# Patient Record
Sex: Female | Born: 1985 | Hispanic: No | Marital: Single | Smoking: Never smoker
Health system: Southern US, Community
[De-identification: ages and names within clinical notes are randomized; demographics above are authoritative.]

## PROBLEM LIST (undated history)

## (undated) DIAGNOSIS — U071 COVID-19: Secondary | ICD-10-CM

## (undated) DIAGNOSIS — F419 Anxiety disorder, unspecified: Secondary | ICD-10-CM

## (undated) HISTORY — DX: Anxiety disorder, unspecified: F41.9

## (undated) HISTORY — DX: COVID-19: U07.1

## (undated) HISTORY — PX: TONSILECTOMY/ADENOIDECTOMY WITH MYRINGOTOMY: SHX6125

---
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
## 2010-01-28 DIAGNOSIS — F419 Anxiety disorder, unspecified: Secondary | ICD-10-CM | POA: Insufficient documentation

## 2010-03-26 DIAGNOSIS — F418 Other specified anxiety disorders: Secondary | ICD-10-CM | POA: Insufficient documentation

## 2012-06-21 ENCOUNTER — Ambulatory Visit: Payer: Self-pay | Admitting: Physician Assistant

## 2013-01-16 ENCOUNTER — Emergency Department: Payer: Self-pay | Admitting: Emergency Medicine

## 2018-06-23 DIAGNOSIS — D219 Benign neoplasm of connective and other soft tissue, unspecified: Secondary | ICD-10-CM | POA: Insufficient documentation

## 2019-04-17 DIAGNOSIS — N926 Irregular menstruation, unspecified: Secondary | ICD-10-CM | POA: Insufficient documentation

## 2019-05-06 DIAGNOSIS — J45909 Unspecified asthma, uncomplicated: Secondary | ICD-10-CM

## 2019-05-06 HISTORY — DX: Unspecified asthma, uncomplicated: J45.909

## 2019-07-25 DIAGNOSIS — K219 Gastro-esophageal reflux disease without esophagitis: Secondary | ICD-10-CM | POA: Insufficient documentation

## 2019-07-31 DIAGNOSIS — F410 Panic disorder [episodic paroxysmal anxiety] without agoraphobia: Secondary | ICD-10-CM | POA: Insufficient documentation

## 2019-07-31 DIAGNOSIS — F321 Major depressive disorder, single episode, moderate: Secondary | ICD-10-CM | POA: Insufficient documentation

## 2019-07-31 DIAGNOSIS — F32A Depression, unspecified: Secondary | ICD-10-CM | POA: Insufficient documentation

## 2019-08-13 ENCOUNTER — Ambulatory Visit: Payer: Managed Care, Other (non HMO) | Admitting: Cardiovascular Disease

## 2019-08-13 ENCOUNTER — Other Ambulatory Visit: Payer: Self-pay

## 2019-08-13 ENCOUNTER — Encounter: Payer: Self-pay | Admitting: Cardiovascular Disease

## 2019-08-13 ENCOUNTER — Emergency Department
Admission: EM | Admit: 2019-08-13 | Discharge: 2019-08-13 | Disposition: A | Discharge status: Home / Self Care | Payer: Managed Care, Other (non HMO) | Attending: Emergency Medicine | Admitting: Emergency Medicine

## 2019-08-13 ENCOUNTER — Ambulatory Visit (INDEPENDENT_AMBULATORY_CARE_PROVIDER_SITE_OTHER): Payer: Managed Care, Other (non HMO)

## 2019-08-13 VITALS — BP 132/82 | HR 101 | Ht 68.0 in | Wt 138.2 lb

## 2019-08-13 DIAGNOSIS — I479 Paroxysmal tachycardia, unspecified: Secondary | ICD-10-CM

## 2019-08-13 DIAGNOSIS — R55 Syncope and collapse: Secondary | ICD-10-CM | POA: Insufficient documentation

## 2019-08-13 DIAGNOSIS — F419 Anxiety disorder, unspecified: Secondary | ICD-10-CM

## 2019-08-13 DIAGNOSIS — R202 Paresthesia of skin: Secondary | ICD-10-CM | POA: Insufficient documentation

## 2019-08-13 DIAGNOSIS — J309 Allergic rhinitis, unspecified: Secondary | ICD-10-CM | POA: Insufficient documentation

## 2019-08-13 DIAGNOSIS — G43809 Other migraine, not intractable, without status migrainosus: Secondary | ICD-10-CM | POA: Insufficient documentation

## 2019-08-13 DIAGNOSIS — R42 Dizziness and giddiness: Secondary | ICD-10-CM

## 2019-08-13 DIAGNOSIS — R7303 Prediabetes: Secondary | ICD-10-CM | POA: Insufficient documentation

## 2019-08-13 DIAGNOSIS — J310 Chronic rhinitis: Secondary | ICD-10-CM | POA: Insufficient documentation

## 2019-08-13 MED ORDER — ALPRAZOLAM 0.25 MG PO TABS: 0.2500 mg | ORAL_TABLET | Freq: Three times a day (TID) | ORAL | 0 refills | Status: DC | PRN

## 2019-08-13 NOTE — Patient Instructions (Addendum)
Please stay hydrated  Zio monitor ordered for tachycardia  Medication Instructions:  No changes  If you need a refill on your cardiac medications before your next appointment, please call your pharmacy.    Lab work: TSH at [facility]   If you have labs (blood work) drawn today and your tests are completely normal, you will receive your results only by: Marland Kitchen MyChart Message (if you have MyChart) OR . A paper copy in the mail If you have any lab test that is abnormal or we need to change your treatment, we will call you to review the results.   Testing/Procedures: Your physician has recommended that you wear a Zio monitor. This monitor is a medical device that records the heart's electrical activity. Doctors most often use these monitors to diagnose arrhythmias. Arrhythmias are problems with the speed or rhythm of the heartbeat. The monitor is a small device applied to your chest. You can wear one while you do your normal daily activities. While wearing this monitor if you have any symptoms to push the button and record what you felt. Once you have worn this monitor for the period of time provider prescribed (Usually 14 days), you will return the monitor device in the postage paid box. Once it is returned they will download the data collected and provide [place] with a report which the provider will then review and we will call you with those results. Important tips:  1. Avoid showering during the first 24 hours of wearing the monitor. 2. Avoid excessive sweating to help maximize wear time. 3. Do not submerge the device, no hot tubs, and no swimming pools. 4. Keep any lotions or oils away from the patch. 5. After 24 hours you may shower with the patch on. Take brief showers with your back facing the shower head.  6. Do not remove patch once it has been placed because that will interrupt data and decrease adhesive wear time. 7. Push the button when you have any symptoms and write down what you were  feeling. 8. Once you have completed wearing your monitor, remove and place into box which has postage paid and place in your outgoing mailbox.  9. If for some reason you have misplaced your box then call our office and we can provide another box and/or mail it off for you.        Follow-Up: At [facility], you and your health needs are our priority.  As part of our continuing mission to provide you with exceptional heart care, we have created designated Provider Care Teams.  These Care Teams include your primary Cardiologist (physician) and Advanced Practice Providers (APPs -  Physician Assistants and Nurse Practitioners) who all work together to provide you with the care you need, when you need it.  . You will need a follow up appointment as needed . We will call with the results of the zio monitor  . Providers on your designated Care Team:   . Murray Hodgkins, NP . Christell Faith, PA-C . Marrianne Mood, PA-C  Any Other Special Instructions Will Be Listed Below (If Applicable).  COVID-19 Vaccine Information can be found at: [email or web address] For questions related to vaccine distribution or appointments, please email [email or web address] or call [phone].

## 2019-08-13 NOTE — Discharge Instructions (Signed)
It is important that you keep your scheduled appointment with your psychologist and psychiatrist.   Follow up with your PCP if you have additional concerns prior to your appointment.

## 2019-08-13 NOTE — ED Provider Notes (Signed)
[facility] Emergency Department Provider Note  ____________________________________________  Time seen: Approximately 4:02 PM  I have reviewed the triage vital signs and the nursing notes.   HISTORY  Chief Complaint medication refill   HPI Cynthia Mcintosh is a 34 y.o. female who presents to the emergency department for treatment and evaluation of anxiety.  Patient states that since her endoscopy she has had severe anxiety.  She states that she actually had an episode of chest pain with weakness and shortness of breath that prompted her to go to the emergency department via EMS.  She had a follow-up appointment today with Dr. Rockey Situ and again developed severe anxiety.  She states that she can feel her heart racing.  She denies current chest pain.  She is wearing a Holter monitor for the next 2 weeks.  She was sent to the emergency department from the cardiologist office for further evaluation and treatment of anxiety.  She has an appointment scheduled with a therapist in 2 weeks.  Past Medical History:  Diagnosis Date  . Anxiety     Patient Active Problem List   Diagnosis Date Noted  . Allergic rhinitis 08/13/2019  . Migraine variant 08/13/2019  . Paresthesias 08/13/2019  . Postural dizziness with presyncope 08/13/2019  . Prediabetes 08/13/2019  . 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 Panic attacks 07/31/2019  . Menstrual cycle disorder 04/17/2019  . Situational anxiety 03/26/2010    Past Surgical History:  Procedure Laterality Date  . TONSILECTOMY/ADENOIDECTOMY WITH MYRINGOTOMY      Prior to Admission medications   Medication Sig Start Date End Date Taking? Authorizing Provider  ALPRAZolam (XANAX) 0.25 MG tablet Take 1 tablet (0.25 mg total) by mouth 3 (three) times daily as needed for anxiety. 08/13/19 08/12/20  Haset Oaxaca, Johnette Abraham B, FNP  HYDROXYZINE PAMOATE PO Take by mouth.    [provider]  Multiple Vitamin (MULTI-VITAMIN) tablet Take 1  tablet by mouth daily.    [provider]  traZODone (DESYREL) 50 MG tablet Take by mouth. 07/31/19 10/29/19  [provider]    Allergies Patient has no known allergies.  Family History  Problem Relation Age of Onset  . Hypertension Mother   . Hypercholesterolemia Mother   . Hypertension Father   . Thyroid disease Father   . COPD Maternal Grandmother   . Diabetes Maternal Grandfather   . Hypertension Maternal Grandfather   . Stroke Maternal Grandfather     Social History Social History   Tobacco Use  . Smoking status: Never Smoker  . Smokeless tobacco: Never Used  Substance Use Topics  . Alcohol use: Not Currently  . Drug use: Not Currently    Review of Systems Constitutional: Negative for fever. ENT: Negative for sore throat. Cardiac/Respiratory: Negative for chest pain. Negative for shortness of breath currently. Gastrointestinal: No abdominal pain.  No nausea, no vomiting.  No diarrhea.  Musculoskeletal: Negative for generalized body aches. Skin: Negataive for rash/lesion/wound. Neurological: Negative for headaches, focal weakness or numbness.  ____________________________________________   PHYSICAL EXAM:  VITAL SIGNS: ED Triage Vitals  Enc Vitals Group     BP 08/13/19 1552 121/75     Pulse Rate 08/13/19 1552 86     Resp 08/13/19 1552 18     Temp 08/13/19 1552 98.9 F (37.2 C)     Temp Source 08/13/19 1552 Oral     SpO2 08/13/19 1552 100 %     Weight 08/13/19 1552 136 lb 11 oz (62 kg)     Height  08/13/19 1552 5\' 8"  (1.727 m)     Head Circumference --      Peak Flow --      Pain Score 08/13/19 1549 0     Pain Loc --      Pain Edu? --      Excl. in [place]? --     Constitutional: Alert and oriented. Well appearing and in no acute distress. Eyes: Conjunctivae are normal. PERRL. EOMI. Head: Atraumatic. Nose: No congestion/rhinnorhea. Mouth/Throat: Mucous membranes are moist. Neck: No stridor.  Cardiovascular: Normal rate, regular rhythm.  Good peripheral circulation. Respiratory: Normal respiratory effort. Musculoskeletal: Full ROM throughout.  Neurologic:  Normal speech and language. No gross focal neurologic deficits are appreciated. Speech is normal. No gait instability. Skin:  Skin is warm, dry and intact. No rash noted. Psychiatric: Very anxious. Speaking rapidly with expression.   ____________________________________________   LABS (all labs ordered are listed, but only abnormal results are displayed)  Labs Reviewed - No data to display ____________________________________________  EKG  Not indicated. ____________________________________________  RADIOLOGY  Not indicated. ____________________________________________   PROCEDURES  None ____________________________________________   INITIAL IMPRESSION / ASSESSMENT AND PLAN / ED COURSE     Pertinent labs & imaging results that were available during my care of the patient were reviewed by me and considered in my medical decision making (see chart for details).  Patient was advised to keep her scheduled appointment with psychiatry and psychology.  She was advised to follow-up with her primary care provider for symptoms of concern prior to those scheduled appointments.  She will be given a short supply of Xanax 0.25 mg, but was advised that the emergency department does not refill these medications and that she should take them only for severe panic/anxiety. ____________________________________________   FINAL CLINICAL IMPRESSION(S) / ED DIAGNOSES  Final diagnoses:  Anxiety       Victorino Dike, FNP 08/13/19 1638    Arta Silence, MD 08/13/19 2113

## 2019-08-13 NOTE — Addendum Note (Signed)
Addended by: Valora Corporal on: 08/13/2019 04:09 PM   Modules accepted: Orders

## 2019-08-13 NOTE — Progress Notes (Signed)
Cardiology Office Note  Date:  08/13/2019   ID:  Cynthia Mcintosh, DOB [birth year], MRN [id number]  PCP:  Carloyn Manner, MD   Chief Complaint  Patient presents with  . POTTS Symptoms    New patient self referral, medications verbally reviewed with patient    HPI:  Cynthia Mcintosh is a 34 year old woman with past medical history of Anxiety/depression/panic attacks dating back to 2012 Presents for new patient discussion of her tachycardia  Primary care notes reviewed, detailing long history of anxiety Previously tried Lexapro, BuSpar, Celexa, Xanax, amitriptyline Prior panic attacks, has called EMS for presyncope and tachycardia symptoms Panic attack triggered by new work environment at [employer] of driving also an issue Primary care referred to psychology  Previously seen in the emergency room January 2021 for chest pain/chest tightness Seen in [place] At that time reported fatigue, malaise, nausea, sinus pressure, chest congestion with discomfort, loss of appetite, shortness of breath, abdominal discomfort with cramping, mild diarrhea Work-up in the ER unrevealing for cardiac etiology She was given albuterol inhaler  On further discussion she reports anxiety symptoms have been worse in the past several weeks She is having difficulty sleeping despite taking low-dose trazodone 50 daily and hydroxyzine She has appointment with therapy tomorrow, psychiatry sometime in August She does not feel that psychiatry appointment is sooner now given worsening symptoms In exam room today is shaky, tachycardic On standing up heart rate up to 134 bpm  She does have some lightheadedness and dizziness if she sits up Orthostatics were done, no drop in blood pressure, 409 up to 811 systolic with sitting and standing  Currently staying with family locally, does not feel that she is able to go to work secondary to anxiety symptoms Denies any recent triggers  EKG personally reviewed  by myself on todays visit Shows sinus tachycardia rate 101 bpm no significant ST or T wave changes   PMH:   has a past medical history of Anxiety.  PSH:    Past Surgical History:  Procedure Laterality Date  . TONSILECTOMY/ADENOIDECTOMY WITH MYRINGOTOMY      Current Outpatient Medications  Medication Sig Dispense Refill  . HYDROXYZINE PAMOATE PO Take by mouth.    . Multiple Vitamin (MULTI-VITAMIN) tablet Take 1 tablet by mouth daily.    . traZODone (DESYREL) 50 MG tablet Take by mouth.     No current facility-administered medications for this visit.     Allergies:   Patient has no known allergies.   Social History:  The patient  reports that she has never smoked. She has never used smokeless tobacco. She reports previous alcohol use. She reports previous drug use.   Family History:   family history includes COPD in her maternal grandmother; Diabetes in her maternal grandfather; Hypercholesterolemia in her mother; Hypertension in her father, maternal grandfather, and mother; Stroke in her maternal grandfather; Thyroid disease in her father.    Review of Systems: Review of Systems  Constitutional: Negative.   Respiratory: Negative.   Cardiovascular: Negative.   Gastrointestinal: Negative.   Musculoskeletal: Negative.   Neurological: Negative.   Psychiatric/Behavioral: The patient is nervous/anxious and has insomnia.   All other systems reviewed and are negative.   PHYSICAL EXAM: VS:  BP 132/82 (BP Location: Right Arm, Patient Position: Sitting, Cuff Size: Normal)   Pulse (!) 101   Ht 5\' 8"  (1.727 m)   Wt 138 lb 3.2 oz (62.7 kg)   LMP 08/10/2019 (Approximate)   SpO2 98%   BMI 21.01  kg/m  , BMI Body mass index is 21.01 kg/m. GEN: Well nourished, well developed, in no acute distress HEENT: normal Neck: no JVD, carotid bruits, or masses Cardiac: Tachycardic, regular no murmurs, rubs, or gallops,no edema  Respiratory:  clear to auscultation bilaterally, normal work of  breathing GI: soft, nontender, nondistended, + BS MS: no deformity or atrophy Skin: warm and dry, no rash Neuro:  Strength and sensation are intact Psych: euthymic mood, full affect   Recent Labs: No results found for requested labs within last 8760 hours.    Lipid Panel No results found for: CHOL, HDL, LDLCALC, TRIG    Wt Readings from Last 3 Encounters:  08/13/19 138 lb 3.2 oz (62.7 kg)      ASSESSMENT AND PLAN:  Problem List Items Addressed This Visit    None    Visit Diagnoses    Paroxysmal tachycardia (HCC)    -  Primary   Anxiety       Relevant Medications   traZODone (DESYREL) 50 MG tablet   HYDROXYZINE PAMOATE PO   Dizziness         Anxiety Severe symptoms, acute flare of her chronic issues which seem to date back 9 to 10 years Has appointment with therapy tomorrow, psychiatry sometime in August Symptoms are not well controlled on trazodone low-dose with hydroxyzine Difficulty sleeping, waking up anxious, shaky, tachycardic Unable to work past several weeks secondary to symptoms She has very good insight into her issues, requesting help with her anxiety Family member who presents with her today reports that they take Xanax low-dose every day which rheumatically helps her symptoms Patient has been reluctant to take a prescription that is not hers Reports she has taken Xanax in the past, and I believe this worked for her -Symptoms are so severe she is even willing to commit to a voluntary inpatient stay as she feels miserable On exam today , reports that she feels cold, shaky, very anxious, she is tachycardic.  Classic signs of anxiety exacerbation -We will try to call outpatient psychiatry locally on [facility], they will not offer appointment for today -Additional contacts have been provided -Reports that she may not feel safe with herself, in which case she should seek higher level of care or either acute care or emergency room -In terms of cardiac issues,  likely driven by underlying anxiety issues To confirm we have placed a Zio monitor to evaluate heart rhythm and episodes of tachycardia  Paroxysmal tachycardia As above, ZIO monitor placed Less likely POTS though will look for monitor results Recommend she stay aggressively hydrated, avoid caffeine, avoid being excessively overheated   Total encounter time more than 45 minutes  Greater than 50% was spent in counseling and coordination of care with the patient    Signed, Esmond Plants, M.D., Ph.D. [place], [place]

## 2019-08-13 NOTE — ED Notes (Addendum)
Pt brought over from Cardiologist office with c/o needing anxiety medication. Pt states she was over there and jsut became anxious and felt she was going to have panic attack. Pt states she doesn't need blood drawn or anything .She just needs something for her anxiety and the cardiologist is unable to prescribe anything.  Pt denies any SI or HI. Pt is calm and cooperative.

## 2019-08-14 NOTE — Addendum Note (Signed)
Addended by: Britt Bottom on: 08/14/2019 12:58 PM   Modules accepted: Orders

## 2019-08-14 NOTE — Addendum Note (Signed)
Addended by: Valora Corporal on: 08/14/2019 08:11 AM   Modules accepted: Orders

## 2019-08-31 ENCOUNTER — Emergency Department: Payer: Managed Care, Other (non HMO)

## 2019-08-31 ENCOUNTER — Telehealth: Payer: Self-pay | Admitting: Cardiovascular Disease

## 2019-08-31 ENCOUNTER — Other Ambulatory Visit: Payer: Self-pay

## 2019-08-31 ENCOUNTER — Encounter: Payer: Self-pay | Admitting: Emergency Medicine

## 2019-08-31 DIAGNOSIS — R000 Tachycardia, unspecified: Secondary | ICD-10-CM | POA: Diagnosis not present

## 2019-08-31 DIAGNOSIS — R42 Dizziness and giddiness: Secondary | ICD-10-CM | POA: Diagnosis not present

## 2019-08-31 DIAGNOSIS — R0789 Other chest pain: Secondary | ICD-10-CM | POA: Diagnosis present

## 2019-08-31 DIAGNOSIS — F419 Anxiety disorder, unspecified: Secondary | ICD-10-CM | POA: Diagnosis not present

## 2019-08-31 LAB — BASIC METABOLIC PANEL
Anion gap: 8 (ref 5–15)
BUN: 12 mg/dL (ref 6–20)
CO2: 26 mmol/L (ref 22–32)
Calcium: 9.6 mg/dL (ref 8.9–10.3)
Chloride: 104 mmol/L (ref 98–111)
Creatinine, Ser: 0.73 mg/dL (ref 0.44–1.00)
GFR calc Af Amer: >60 mL/min (ref >60)
GFR calc non Af Amer: >60 mL/min (ref >60)
Glucose, Bld: 96 mg/dL (ref 70–99)
Potassium: 3.8 mmol/L (ref 3.5–5.1)
Sodium: 138 mmol/L (ref 135–145)

## 2019-08-31 LAB — CBC
HCT: 39.2 % (ref 36.0–46.0)
Hemoglobin: 13 g/dL (ref 12.0–15.0)
MCH: 31.3 pg (ref 26.0–34.0)
MCHC: 33.2 g/dL (ref 30.0–36.0)
MCV: 94.5 fL (ref 80.0–100.0)
Platelets: 248 10*3/uL (ref 150–400)
RBC: 4.15 MIL/uL (ref 3.87–5.11)
RDW: 12.5 % (ref 11.5–15.5)
WBC: 10.1 10*3/uL (ref 4.0–10.5)
nRBC: 0 % (ref 0.0–0.2)

## 2019-08-31 LAB — TROPONIN I (HIGH SENSITIVITY)
Troponin I (High Sensitivity): <2 ng/L (ref <18)
Troponin I (High Sensitivity): <2 ng/L (ref <18)

## 2019-08-31 NOTE — Telephone Encounter (Signed)
STAT if patient feels like he/she is going to faint   1) Are you dizzy now? Yes feels off chest pressure on waking tach palpitations sob on exertion   2) Do you feel faint or have you passed out? Yes no syncope   3) Do you have any other symptoms?  Lightheaded dizzy tach palpitations extreme fatigue with any activity for last month no change even after seen in ED for anxiety given xanax   4) Have you checked your HR and BP (record if available)?   No bp cuff at home  HR 100-120 elevated with activity

## 2019-08-31 NOTE — Telephone Encounter (Signed)
  Patient Consent for Virtual Visit         Cynthia Mcintosh has provided verbal consent on 08/31/2019 for a virtual visit (video or telephone).   CONSENT FOR VIRTUAL VISIT FOR:  Cynthia Mcintosh  By participating in this virtual visit I agree to the following:  I hereby voluntarily request, consent and authorize [facility] and its employed or contracted physicians, physician assistants, nurse practitioners or other licensed health care professionals (the Practitioner), to provide me with telemedicine health care services (the "Services") as deemed necessary by the treating Practitioner. I acknowledge and consent to receive the Services by the Practitioner via telemedicine. I understand that the telemedicine visit will involve communicating with the Practitioner through live audiovisual communication technology and the disclosure of certain medical information by electronic transmission. I acknowledge that I have been given the opportunity to request an in-person assessment or other available alternative prior to the telemedicine visit and am voluntarily participating in the telemedicine visit.  I understand that I have the right to withhold or withdraw my consent to the use of telemedicine in the course of my care at any time, without affecting my right to future care or treatment, and that the Practitioner or I may terminate the telemedicine visit at any time. I understand that I have the right to inspect all information obtained and/or recorded in the course of the telemedicine visit and may receive copies of available information for a reasonable fee.  I understand that some of the potential risks of receiving the Services via telemedicine include:  Marland Kitchen Delay or interruption in medical evaluation due to technological equipment failure or disruption; . Information transmitted may not be sufficient (e.g. poor resolution of images) to allow for appropriate medical decision making by the Practitioner; and/or   . In rare instances, security protocols could fail, causing a breach of personal health information.  Furthermore, I acknowledge that it is my responsibility to provide information about my medical history, conditions and care that is complete and accurate to the best of my ability. I acknowledge that Practitioner's advice, recommendations, and/or decision may be based on factors not within their control, such as incomplete or inaccurate data provided by me or distortions of diagnostic images or specimens that may result from electronic transmissions. I understand that the practice of medicine is not an exact science and that Practitioner makes no warranties or guarantees regarding treatment outcomes. I acknowledge that a copy of this consent can be made available to me via my patient portal ([place]), or I can request a printed copy by calling the office of [facility].    I understand that my insurance will be billed for this visit.   I have read or had this consent read to me. . I understand the contents of this consent, which adequately explains the benefits and risks of the Services being provided via telemedicine.  . I have been provided ample opportunity to ask questions regarding this consent and the Services and have had my questions answered to my satisfaction. . I give my informed consent for the services to be provided through the use of telemedicine in my medical care

## 2019-08-31 NOTE — ED Triage Notes (Signed)
Pt presents to ER from home via EMS with complaints of Chest pressure, light headache, dizziness, mbr reports started new medications (beta blocker) pt anxious in triage. Pt talks in complete sentences no respiratory distress noted

## 2019-08-31 NOTE — Telephone Encounter (Signed)
Spoke with patient and she states that she continues to have some problems with elevated heart rate and she feels that it is not regulated. She does recognize that anxiety can be causing some of this but she is still having problems. She does have virtual visit with psychiatry today so reviewed that if they can get her on the right medications for her anxiety it may help with the heart rates. Let her know that I would make provider aware and to see what psychiatry may recommend for her treatment. She verbalized understanding with no further questions at this time.

## 2019-08-31 NOTE — Telephone Encounter (Signed)
We will forward results of the ZIO monitor once they become available

## 2019-08-31 NOTE — ED Notes (Signed)
Lab called for repeat troponin.

## 2019-09-01 ENCOUNTER — Emergency Department
Admission: EM | Admit: 2019-09-01 | Discharge: 2019-09-01 | Disposition: A | Discharge status: Home / Self Care | Payer: Managed Care, Other (non HMO) | Attending: Emergency Medicine | Admitting: Emergency Medicine

## 2019-09-01 DIAGNOSIS — R079 Chest pain, unspecified: Secondary | ICD-10-CM

## 2019-09-01 DIAGNOSIS — F419 Anxiety disorder, unspecified: Secondary | ICD-10-CM

## 2019-09-01 LAB — TSH: TSH: 1.108 u[IU]/mL (ref 0.350–4.500)

## 2019-09-01 LAB — T4, FREE: Free T4: 1.16 ng/dL — ABNORMAL HIGH (ref 0.61–1.12)

## 2019-09-01 MED ORDER — ONDANSETRON 4 MG PO TBDP
4.0000 mg | ORAL_TABLET | Freq: Once | ORAL | Status: AC
  Administered 2019-09-01: 4 mg via ORAL
  Filled 2019-09-01: qty 1

## 2019-09-01 MED ORDER — LORAZEPAM 1 MG PO TABS
1.0000 mg | ORAL_TABLET | Freq: Every day | ORAL | 0 refills | Status: AC | PRN
Start: 2019-09-01 — End: 2019-10-01

## 2019-09-01 MED ORDER — LORAZEPAM 1 MG PO TABS
1.0000 mg | ORAL_TABLET | Freq: Once | ORAL | Status: AC
  Administered 2019-09-01: 1 mg via ORAL
  Filled 2019-09-01: qty 1

## 2019-09-01 NOTE — ED Provider Notes (Signed)
[facility] Emergency Department Provider Note  Time seen: 7:33 AM  I have reviewed the triage vital signs and the nursing notes.   HISTORY  Chief Complaint Chest Pain    HPI Cynthia Mcintosh is a 34 y.o. female with a past medical history of anxiety, presents to the emergency department for intermittent tachycardia and chest pressure.  According to the patient a little over a month ago she developed tachycardia symptoms.  Patient states that started approximately 2 days after an endoscopy.  Since that time she has had intermittent symptoms of significant dizziness, lightheadedness tachycardia and panic.  Patient is diagnosed with anxiety.  Has seen her primary care doctor.  Patient saw a psychiatrist was recently started on trazodone as well as Prozac.  Patient is also followed up with a cardiologist and finished a 7-day Holter monitor which she turned in several days ago but is not yet received the results of that test.  Patient has tried trazodone, is currently on Prozac, has tried propranolol without relief.  Patient appears quite anxious during my evaluation.  Denies drugs or alcohol.  Patient does state a lack of sleep but has only been on the trazodone for approximately 2 weeks, states it does work when she takes it.  Past Medical History:  Diagnosis Date  . Anxiety     Patient Active Problem List   Diagnosis Date Noted  . Allergic rhinitis 08/13/2019  . Migraine variant 08/13/2019  . Paresthesias 08/13/2019  . Postural dizziness with presyncope 08/13/2019  . Prediabetes 08/13/2019  . 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 Panic attacks 07/31/2019  . Menstrual cycle disorder 04/17/2019  . Situational anxiety 03/26/2010    Past Surgical History:  Procedure Laterality Date  . TONSILECTOMY/ADENOIDECTOMY WITH MYRINGOTOMY      Prior to Admission medications   Medication Sig Start Date End Date Taking? Authorizing Provider  ALPRAZolam (XANAX) 0.25 MG  tablet Take 1 tablet (0.25 mg total) by mouth 3 (three) times daily as needed for anxiety. 08/13/19 08/12/20  Triplett, Johnette Abraham B, FNP  HYDROXYZINE PAMOATE PO Take by mouth.    [provider]  LORazepam (ATIVAN) 1 MG tablet Take 1 tablet (1 mg total) by mouth daily as needed for anxiety. 09/01/19 10/01/19  Harvest Dark, MD  Multiple Vitamin (MULTI-VITAMIN) tablet Take 1 tablet by mouth daily.    [provider]  traZODone (DESYREL) 50 MG tablet Take by mouth. 07/31/19 10/29/19  [provider]    No Known Allergies  Family History  Problem Relation Age of Onset  . Hypertension Mother   . Hypercholesterolemia Mother   . Hypertension Father   . Thyroid disease Father   . COPD Maternal Grandmother   . Diabetes Maternal Grandfather   . Hypertension Maternal Grandfather   . Stroke Maternal Grandfather     Social History Social History   Tobacco Use  . Smoking status: Never Smoker  . Smokeless tobacco: Never Used  Substance Use Topics  . Alcohol use: Not Currently  . Drug use: Not Currently    Review of Systems Constitutional: Negative for fever. Cardiovascular: Negative for chest pain. Respiratory: Negative for shortness of breath. Gastrointestinal: Negative for abdominal pain, vomiting Musculoskeletal: Negative for musculoskeletal complaints Neurological: Negative for headache All other ROS negative  ____________________________________________   PHYSICAL EXAM:  VITAL SIGNS: ED Triage Vitals  Enc Vitals Group     BP 08/31/19 1845 117/77     Pulse Rate 08/31/19 1845 76     Resp 08/31/19  1845 18     Temp 08/31/19 1845 98.6 F (37 C)     Temp Source 08/31/19 1845 Oral     SpO2 08/31/19 1845 100 %     Weight 08/31/19 1847 135 lb (61.2 kg)     Height 08/31/19 1847 5\' 8"  (1.727 m)     Head Circumference --      Peak Flow --      Pain Score 08/31/19 1907 0     Pain Loc --      Pain Edu? --      Excl. in [place]? --     Constitutional: Alert and  oriented. Well appearing, but somewhat anxious. Eyes: Normal exam ENT      Head: Normocephalic and atraumatic.       Mouth/Throat: Mucous membranes are moist. Cardiovascular: Normal rate, regular rhythm. No murmur Respiratory: Normal respiratory effort without tachypnea nor retractions. Breath sounds are clear  Gastrointestinal: Soft and nontender. No distention.  Musculoskeletal: Nontender with normal range of motion in all extremities.  Neurologic:  Normal speech and language. No gross focal neurologic deficits  Skin:  Skin is warm, dry and intact.  Psychiatric: Mood and affect are normal.   ____________________________________________    EKG  EKG viewed and interpreted by myself shows a normal sinus rhythm at 74 bpm with a narrow QRS, normal axis, normal intervals, no concerning ST changes.  Normal EKG.  ____________________________________________    RADIOLOGY  Chest x-ray is negative  ____________________________________________   INITIAL IMPRESSION / ASSESSMENT AND PLAN / ED COURSE  Pertinent labs & imaging results that were available during my care of the patient were reviewed by me and considered in my medical decision making (see chart for details).   Patient presents to the emergency department with symptoms of intermittent tachycardia lightheadedness, dizziness, panic.  At this point the patient has seen a PCP, cardiologist and psychiatrist.  Patient has been started on Prozac but this was very recent.  Has been on trazodone for approximately 2 weeks.  Has tried propranolol without relief.  Patient states she has been out of work for nearly 1 month since her symptoms started.  At this point I do believe a trial of benzodiazepines would be warranted.  I spoke to the patient regarding a one-time prescription of Ativan however I had a long discussion with the patient as this is not something we would want her to be using every day as it could be habit forming.  I also  discussed not drinking alcohol or driving while taking the medication.  I also discussed follow-up with her psychiatrist to see if this is a medication that they would like her to be on chronically or at least until the Prozac could take effect.  Remainder the patient's work-up in the emergency department is normal including cardiac enzymes x2, thyroid function EKG and chest x-ray.  Anaija Wissink was evaluated in Emergency Department on 09/01/2019 for the symptoms described in the history of present illness. She was evaluated in the context of the global COVID-19 pandemic, which necessitated consideration that the patient might be at risk for infection with the SARS-CoV-2 virus that causes COVID-19. Institutional protocols and algorithms that pertain to the evaluation of patients at risk for COVID-19 are in a state of rapid change based on information released by regulatory bodies including the CDC and federal and state organizations. These policies and algorithms were followed during the patient's care in the ED.  ____________________________________________   FINAL CLINICAL IMPRESSION(S) /  ED DIAGNOSES  Anxiety Intermittent tachycardia   Harvest Dark, MD 09/01/19 [phone]

## 2019-09-01 NOTE — ED Notes (Signed)
Pt requesting nausea medication. md notified.

## 2019-09-05 ENCOUNTER — Telehealth: Payer: Self-pay | Admitting: Cardiovascular Disease

## 2019-09-05 ENCOUNTER — Encounter: Payer: Self-pay | Admitting: Cardiovascular Disease

## 2019-09-05 ENCOUNTER — Telehealth: Payer: Managed Care, Other (non HMO) | Admitting: Cardiovascular Disease

## 2019-09-05 NOTE — Telephone Encounter (Signed)
Spoke with patient about her appointment today with provider. Advised that since we are still waiting on her results that it may be better for [place] to reschedule or wait for those results to come in first. She inquired about [place] filling out FMLA for her job but again reviewed that with no active cardiac diagnosis we would not be able to do those for her at this time. Encouraged her to have those filled out by her primary care provider, psychiatrist, or her psychologist. Also reviewed that they can fill out forms to be retroactive in order to cover her time missed. Reviewed that once her results come in if there are no active problems showing on the monitor then she may not need follow up appointment. She was agreeable to wait for results and then we can see if repeat appointment is needed. She verbalized understanding of our conversation, agreement with plan, and had no further questions at this time.

## 2019-09-16 DIAGNOSIS — K22710 Barrett's esophagus with low grade dysplasia: Secondary | ICD-10-CM | POA: Insufficient documentation

## 2019-09-17 ENCOUNTER — Ambulatory Visit: Payer: Managed Care, Other (non HMO) | Admitting: Family

## 2019-10-02 ENCOUNTER — Other Ambulatory Visit: Payer: Self-pay

## 2019-10-02 ENCOUNTER — Encounter: Payer: Self-pay | Admitting: *Deleted

## 2019-10-02 ENCOUNTER — Emergency Department
Admission: EM | Admit: 2019-10-02 | Discharge: 2019-10-02 | Disposition: A | Discharge status: Home / Self Care | Payer: Managed Care, Other (non HMO) | Attending: Emergency Medicine | Admitting: Emergency Medicine

## 2019-10-02 ENCOUNTER — Emergency Department: Payer: Managed Care, Other (non HMO)

## 2019-10-02 DIAGNOSIS — R079 Chest pain, unspecified: Secondary | ICD-10-CM | POA: Diagnosis not present

## 2019-10-02 DIAGNOSIS — R519 Headache, unspecified: Secondary | ICD-10-CM | POA: Diagnosis present

## 2019-10-02 DIAGNOSIS — Z5321 Procedure and treatment not carried out due to patient leaving prior to being seen by health care provider: Secondary | ICD-10-CM | POA: Insufficient documentation

## 2019-10-02 DIAGNOSIS — R0602 Shortness of breath: Secondary | ICD-10-CM | POA: Insufficient documentation

## 2019-10-02 LAB — BASIC METABOLIC PANEL
Anion gap: 10 (ref 5–15)
BUN: 10 mg/dL (ref 6–20)
CO2: 23 mmol/L (ref 22–32)
Calcium: 9.7 mg/dL (ref 8.9–10.3)
Chloride: 105 mmol/L (ref 98–111)
Creatinine, Ser: 0.75 mg/dL (ref 0.44–1.00)
GFR calc Af Amer: >60 mL/min (ref >60)
GFR calc non Af Amer: >60 mL/min (ref >60)
Glucose, Bld: 96 mg/dL (ref 70–99)
Potassium: 3.6 mmol/L (ref 3.5–5.1)
Sodium: 138 mmol/L (ref 135–145)

## 2019-10-02 LAB — CBC
HCT: 38.3 % (ref 36.0–46.0)
Hemoglobin: 13 g/dL (ref 12.0–15.0)
MCH: 32.3 pg (ref 26.0–34.0)
MCHC: 33.9 g/dL (ref 30.0–36.0)
MCV: 95.3 fL (ref 80.0–100.0)
Platelets: 221 10*3/uL (ref 150–400)
RBC: 4.02 MIL/uL (ref 3.87–5.11)
RDW: 12.7 % (ref 11.5–15.5)
WBC: 10.4 10*3/uL (ref 4.0–10.5)
nRBC: 0 % (ref 0.0–0.2)

## 2019-10-02 LAB — TROPONIN I (HIGH SENSITIVITY): Troponin I (High Sensitivity): 3 ng/L (ref <18)

## 2019-10-02 NOTE — ED Triage Notes (Addendum)
Pt ambulatory to triage.  Pt reports h/a and intermittent sob with pain in chest.  Hx anxiety.  Pt was seen here 3 weeks ago for similar sx.  Pt alert.  Speech clear.

## 2019-10-02 NOTE — ED Notes (Signed)
Pt refusing cxr.

## 2019-10-11 ENCOUNTER — Telehealth: Payer: Self-pay | Admitting: Cardiovascular Disease

## 2019-10-11 NOTE — Telephone Encounter (Signed)
Received records request, forwarded to Medical records for processing

## 2019-10-29 ENCOUNTER — Other Ambulatory Visit: Payer: Self-pay

## 2019-10-29 ENCOUNTER — Encounter: Payer: Self-pay | Admitting: Certified Nurse Midwife

## 2019-10-29 ENCOUNTER — Ambulatory Visit (INDEPENDENT_AMBULATORY_CARE_PROVIDER_SITE_OTHER): Payer: Managed Care, Other (non HMO) | Admitting: Certified Nurse Midwife

## 2019-10-29 VITALS — BP 121/77 | HR 90 | Wt 137.5 lb

## 2019-10-29 DIAGNOSIS — D251 Intramural leiomyoma of uterus: Secondary | ICD-10-CM | POA: Diagnosis not present

## 2019-10-29 DIAGNOSIS — Z01419 Encounter for gynecological examination (general) (routine) without abnormal findings: Secondary | ICD-10-CM

## 2019-10-29 DIAGNOSIS — Z113 Encounter for screening for infections with a predominantly sexual mode of transmission: Secondary | ICD-10-CM

## 2019-10-29 DIAGNOSIS — N898 Other specified noninflammatory disorders of vagina: Secondary | ICD-10-CM

## 2019-10-29 DIAGNOSIS — Z3009 Encounter for other general counseling and advice on contraception: Secondary | ICD-10-CM

## 2019-10-29 DIAGNOSIS — Z124 Encounter for screening for malignant neoplasm of cervix: Secondary | ICD-10-CM

## 2019-10-29 LAB — POCT URINALYSIS DIPSTICK
Bilirubin, UA: NEGATIVE
Glucose, UA: NEGATIVE
Ketones, UA: NEGATIVE
Leukocytes, UA: NEGATIVE
Nitrite, UA: NEGATIVE
Protein, UA: NEGATIVE
Spec Grav, UA: 1.01 (ref 1.010–1.025)
Urobilinogen, UA: 0.2 E.U./dL
pH, UA: 6 (ref 5.0–8.0)

## 2019-10-29 LAB — POCT URINE PREGNANCY: Preg Test, Ur: NEGATIVE

## 2019-10-29 NOTE — Patient Instructions (Signed)
Preventive Care 21-34 Years Old, Female Preventive care refers to visits with your health care provider and lifestyle choices that can promote health and wellness. This includes:  A yearly physical exam. This may also be called an annual well check.  Regular dental visits and eye exams.  Immunizations.  Screening for certain conditions.  Healthy lifestyle choices, such as eating a healthy diet, getting regular exercise, not using drugs or products that contain nicotine and tobacco, and limiting alcohol use. What can I expect for my preventive care visit? Physical exam Your health care provider will check your:  Height and weight. This may be used to calculate body mass index (BMI), which tells if you are at a healthy weight.  Heart rate and blood pressure.  Skin for abnormal spots. Counseling Your health care provider may ask you questions about your:  Alcohol, tobacco, and drug use.  Emotional well-being.  Home and relationship well-being.  Sexual activity.  Eating habits.  Work and work environment.  Method of birth control.  Menstrual cycle.  Pregnancy history. What immunizations do I need?  Influenza (flu) vaccine  This is recommended every year. Tetanus, diphtheria, and pertussis (Tdap) vaccine  You may need a Td booster every 10 years. Varicella (chickenpox) vaccine  You may need this if you have not been vaccinated. Human papillomavirus (HPV) vaccine  If recommended by your health care provider, you may need three doses over 6 months. Measles, mumps, and rubella (MMR) vaccine  You may need at least one dose of MMR. You may also need a second dose. Meningococcal conjugate (MenACWY) vaccine  One dose is recommended if you are age 19-21 years and a first-year college student living in a residence hall, or if you have one of several medical conditions. You may also need additional booster doses. Pneumococcal conjugate (PCV13) vaccine  You may need  this if you have certain conditions and were not previously vaccinated. Pneumococcal polysaccharide (PPSV23) vaccine  You may need one or two doses if you smoke cigarettes or if you have certain conditions. Hepatitis A vaccine  You may need this if you have certain conditions or if you travel or work in places where you may be exposed to hepatitis A. Hepatitis B vaccine  You may need this if you have certain conditions or if you travel or work in places where you may be exposed to hepatitis B. Haemophilus influenzae type b (Hib) vaccine  You may need this if you have certain conditions. You may receive vaccines as individual doses or as more than one vaccine together in one shot (combination vaccines). Talk with your health care provider about the risks and benefits of combination vaccines. What tests do I need?  Blood tests  Lipid and cholesterol levels. These may be checked every 5 years starting at age 20.  Hepatitis C test.  Hepatitis B test. Screening  Diabetes screening. This is done by checking your blood sugar (glucose) after you have not eaten for a while (fasting).  Sexually transmitted disease (STD) testing.  BRCA-related cancer screening. This may be done if you have a family history of breast, ovarian, tubal, or peritoneal cancers.  Pelvic exam and Pap test. This may be done every 3 years starting at age 21. Starting at age 30, this may be done every 5 years if you have a Pap test in combination with an HPV test. Talk with your health care provider about your test results, treatment options, and if necessary, the need for more tests.   Follow these instructions at home: Eating and drinking   Eat a diet that includes fresh fruits and vegetables, whole grains, lean protein, and low-fat dairy.  Take vitamin and mineral supplements as recommended by your health care provider.  Do not drink alcohol if: ? Your health care provider tells you not to drink. ? You are  pregnant, may be pregnant, or are planning to become pregnant.  If you drink alcohol: ? Limit how much you have to 0-1 drink a day. ? Be aware of how much alcohol is in your drink. In the [place] one drink equals one 12 oz bottle of beer (355 mL), one 5 oz glass of wine (148 mL), or one 1 oz glass of hard liquor (44 mL). Lifestyle  Take daily care of your teeth and gums.  Stay active. Exercise for at least 30 minutes on 5 or more days each week.  Do not use any products that contain nicotine or tobacco, such as cigarettes, e-cigarettes, and chewing tobacco. If you need help quitting, ask your health care provider.  If you are sexually active, practice safe sex. Use a condom or other form of birth control (contraception) in order to prevent pregnancy and STIs (sexually transmitted infections). If you plan to become pregnant, see your health care provider for a preconception visit. What's next?  Visit your health care provider once a year for a well check visit.  Ask your health care provider how often you should have your eyes and teeth checked.  Stay up to date on all vaccines. This information is not intended to replace advice given to you by your health care provider. Make sure you discuss any questions you have with your health care provider. Document Revised: 09/22/2017 Document Reviewed: 09/22/2017 Elsevier Patient Education  2020 Elsevier Inc. Lactic acid; Citric acid; Potassium bitartrate vaginal gel What is this medicine? LACTIC ACID; CITRIC ACID; POTASSIUM BITARTRATE (LAK tuhk AS id; SIH trik AS id; poe TASS ee um bi TAAR treit) is used for birth control when you have sexual intercourse to help prevent pregnancy. This medicine may be used for other purposes; ask your health care provider or pharmacist if you have questions. COMMON BRAND NAME(S): PHEXXI What should I tell my health care provider before I take this medicine? They need to know if you have any of these  conditions:  frequent kidney or urinary tract infections  HIV or AIDS  pelvic or vaginal infection  sexually transmitted disease, like herpes, gonorrhea, or chlamydia  an unusual or allergic reaction to lactic acid, citric acid, potassium bitartrate, other medicines, foods, dyes, or preservatives  pregnant or trying to get pregnant  breast-feeding How should I use this medicine? This medicine is for use in the vagina only. Follow the directions on the prescription label. Wash hands before and after use. To prevent pregnancy, it is very important that this product is used properly. This product must be applied before sexual contact begins. Do not use it in the rectum. Make sure you carefully read and follow the instructions that come with the product. If you have vaginal sex more than 1 time within 1 hour, you must use a new applicator. Use exactly as directed. Talk to your pediatrician regarding the use of this medicine in children. Special care may be needed. Overdosage: If you think you have taken too much of this medicine contact a poison control center or emergency room at once. NOTE: This medicine is only for you. Do not share this medicine   with others. What if I miss a dose? Use before each time you have sexual intercourse as directed on the label. If you miss a dose, you may become pregnant. What may interact with this medicine? Interactions are not expected. This birth control may be used with other medicines used in the vagina to treat infections including miconazole, metronidazole, and tioconazole. If you have questions ask your doctor or pharmacist. Do not use any other vaginal products at the same time without talking to your health care professional. This list may not describe all possible interactions. Give your health care provider a list of all the medicines, herbs, non-prescription drugs, or dietary supplements you use. Also tell them if you smoke, drink alcohol, or use  illegal drugs. Some items may interact with your medicine. What should I watch for while using this medicine? This product does not protect you against HIV infection (AIDS) or other sexually transmitted diseases. This product may be used at any time of your menstrual cycle. This product may be used as soon as your healthcare provider tells you it is safe for you to have sex after childbirth, abortion, or miscarriage. This product may be used with most hormonal birth control methods; a vaginal diaphragm; or latex, polyurethane and polyisoprene condoms. Avoid using this product with contraceptive vaginal rings. What side effects may I notice from receiving this medicine? Side effects that you should report to your doctor or health care professional as soon as possible:  allergic reactions like skin rash, itching or hives, swelling  signs and symptoms of infection like fever; chills; pelvic pain; cloudy urine; pain or trouble passing urine  vaginal discharge, itching or odor  vaginal irritation or burning Side effects that usually do not require medical attention (report these to your doctor or health care professional if they continue or are bothersome):  mild vaginal irritation This list may not describe all possible side effects. Call your doctor for medical advice about side effects. You may report side effects to FDA at 1-800-FDA-1088. Where should I keep my medicine? Keep out of the reach of children. Store at room temperature between 15 and 30 degrees C (59 and 86 degrees F). Keep in the foil pouch until ready for use. Follow the directions on the product label. Throw away any unused medicine after the expiration date. NOTE: This sheet is a summary. It may not cover all possible information. If you have questions about this medicine, talk to your doctor, pharmacist, or health care provider.  2020 Elsevier/Gold Standard (2018-06-22 09:34:36)  

## 2019-10-29 NOTE — Progress Notes (Signed)
ANNUAL PREVENTATIVE CARE GYN  ENCOUNTER NOTE  Subjective:       Cynthia Mcintosh is a 34 y.o. G0P0000 female here for a routine annual gynecologic exam.  Current complaints: 1. Needs Pap ([employer]) 2. Requests STI testing 3. Vaginal odor and irritation, has new partner  Denies difficulty breathing or respiratory distress, chest pain, abdominal pain, excessive vaginal bleeding, dysuria, and leg pain or swelling.    Gynecologic History  Patient's last menstrual period was 10/04/2019 (exact date). Period Cycle (Days): 28 Period Duration (Days): 5 Period Pattern: Regular Menstrual Flow: Light, Moderate Menstrual Control: Tampon, Panty liner Menstrual Control Change Freq (Hours): 3 Dysmenorrhea: (!) Severe Dysmenorrhea Symptoms: Cramping, Headache, Nausea  Contraception: NFP  Last Pap: unsure  Obstetric History  OB History  Gravida Para Term Preterm AB Living  0 0 0 0 0 0  SAB TAB Ectopic Multiple Live Births  0 0 0 0 0    Past Medical History:  Diagnosis Date  . Anxiety     Past Surgical History:  Procedure Laterality Date  . TONSILECTOMY/ADENOIDECTOMY WITH MYRINGOTOMY      Current Outpatient Medications on File Prior to Visit  Medication Sig Dispense Refill  . ELDERBERRY PO Take by mouth.    Marland Kitchen LORazepam (ATIVAN) 1 MG tablet Take by mouth.    . Multiple Vitamin (MULTI-VITAMIN) tablet Take 1 tablet by mouth daily.    . sertraline (ZOLOFT) 25 MG tablet Take by mouth.    Marland Kitchen FLUoxetine (PROZAC) 10 MG capsule Take 10 mg by mouth daily.     No current facility-administered medications on file prior to visit.    No Known Allergies  Social History   Socioeconomic History  . Marital status: Single    Spouse name: Not on file  . Number of children: Not on file  . Years of education: Not on file  . Highest education level: Not on file  Occupational History  . Not on file  Tobacco Use  . Smoking status: Never Smoker  . Smokeless tobacco: Never Used  Vaping  Use  . Vaping Use: Never used  Substance and Sexual Activity  . Alcohol use: Not Currently  . Drug use: Not Currently  . Sexual activity: Yes    Birth control/protection: None  Other Topics Concern  . Not on file  Social History Narrative  . Not on file   Social Determinants of Health   Financial Resource Strain:   . Difficulty of Paying Living Expenses: Not on file  Food Insecurity:   . Worried About Charity fundraiser in the Last Year: Not on file  . Ran Out of Food in the Last Year: Not on file  Transportation Needs:   . Lack of Transportation (Medical): Not on file  . Lack of Transportation (Non-Medical): Not on file  Physical Activity:   . Days of Exercise per Week: Not on file  . Minutes of Exercise per Session: Not on file  Stress:   . Feeling of Stress : Not on file  Social Connections:   . Frequency of Communication with Friends and Family: Not on file  . Frequency of Social Gatherings with Friends and Family: Not on file  . Attends Religious Services: Not on file  . Active Member of Clubs or Organizations: Not on file  . Attends Archivist Meetings: Not on file  . Marital Status: Not on file  Intimate Partner Violence:   . Fear of Current or Ex-Partner: Not on file  .  Emotionally Abused: Not on file  . Physically Abused: Not on file  . Sexually Abused: Not on file    Family History  Problem Relation Age of Onset  . Hypertension Mother   . Hypercholesterolemia Mother   . Hypertension Father   . Thyroid disease Father   . COPD Maternal Grandmother   . Diabetes Maternal Grandfather   . Hypertension Maternal Grandfather   . Stroke Maternal Grandfather     The following portions of the patient's history were reviewed and updated as appropriate: allergies, current medications, past family history, past medical history, past social history, past surgical history and problem list.  Review of Systems  ROS negative except as noted above. Information  obtained from patient.    Objective:   BP 121/77   Pulse 90   Wt 137 lb 8 oz (62.4 kg)   LMP 10/04/2019 (Exact Date)   BMI 20.91 kg/m    CONSTITUTIONAL: Well-developed, well-nourished female in no acute distress.   PSYCHIATRIC: Normal mood and affect. Normal behavior. Normal judgment and thought content.  [place]: Alert and oriented to person, place, and time. Normal muscle tone coordination. No cranial nerve deficit noted.  HENT:  Normocephalic, atraumatic, External right and left ear normal.   EYES: Conjunctivae and EOM are normal. Pupils are equal and round.   NECK: Normal range of motion, supple, no masses.  Normal thyroid.   SKIN: Skin is warm and dry. No rash noted. Not diaphoretic. No erythema. No pallor.  CARDIOVASCULAR: Normal heart rate noted, regular rhythm, no murmur.  RESPIRATORY: Clear to auscultation bilaterally. Effort and breath sounds normal, no problems with respiration noted.  BREASTS: Symmetric in size. No masses, skin changes, nipple drainage, or lymphadenopathy.  ABDOMEN: Soft, normal bowel sounds, no distention noted.  No tenderness, rebound or guarding.   PELVIC:  External Genitalia: Normal  Vagina: Normal  Cervix: Normal, Pap collected  Uterus: Normal  Adnexa: Normal  MUSCULOSKELETAL: Normal range of motion. No tenderness.  No cyanosis, clubbing, or edema.  2+ distal pulses.  LYMPHATIC: No Axillary, Supraclavicular, or Inguinal Adenopathy.  Depression screen PHQ 2/9 11/04/2019  Decreased Interest 1  Down, Depressed, Hopeless 1  PHQ - 2 Score 2  Altered sleeping 2  Tired, decreased energy 2  Change in appetite 2  Feeling bad or failure about yourself  0  Trouble concentrating 1  Moving slowly or fidgety/restless 0  Suicidal thoughts 0  PHQ-9 Score 9  Difficult doing work/chores Extremely dIfficult   GAD 7 : Generalized Anxiety Score 11/04/2019  Nervous, Anxious, on Edge 2  Control/stop worrying 2  Worry too much - different  things 2  Trouble relaxing 2  Restless 1  Easily annoyed or irritable 1  Afraid - awful might happen 2  Total GAD 7 Score 12      Assessment:   Annual gynecologic examination 34 y.o.   Contraception: NFP   Normal BMI   Problem List Items Addressed This Visit    None    Visit Diagnoses    Well woman exam    -  Primary   Relevant Orders   POCT urine pregnancy   POCT urinalysis dipstick   NuSwab Vaginitis Plus (VG+)   Hepatitis C antibody   Hepatitis B Surface AntiGEN   RPR   HIV Antibody (routine testing w rflx)   IGP, Aptima HPV, rfx 16/18,45   Intramural leiomyoma of uterus       Routine screening for STI (sexually transmitted infection)  Relevant Orders   NuSwab Vaginitis Plus (VG+)   Hepatitis C antibody   Hepatitis B Surface AntiGEN   RPR   HIV Antibody (routine testing w rflx)   Encounter for other general counseling and advice on contraception       Relevant Orders   POCT urine pregnancy   Screening for cervical cancer       Relevant Orders   IGP, Aptima HPV, rfx 16/18,45   Vaginal odor       Relevant Orders   POCT urinalysis dipstick      Plan:   Pap: Collected  Labs: See orders  Routine preventative health maintenance measures emphasized: Exercise/Diet/Weight control, Tobacco Warnings, Alcohol/Substance use risks, Stress Management, Peer Pressure Issues and Safe Sex; see AVS  Samples of Phexxi and Boric acid provided  Reviewed red flag symptoms and when to call  Return to Clinic - 1 Year for [employer] or sooner if needed   Dani Gobble, CNM  [facility], [facility] 10/29/19 10:17 AM

## 2019-10-30 LAB — HIV ANTIBODY (ROUTINE TESTING W REFLEX): HIV Screen 4th Generation wRfx: NONREACTIVE

## 2019-10-30 LAB — HEPATITIS B SURFACE ANTIGEN: Hepatitis B Surface Ag: NEGATIVE

## 2019-10-30 LAB — RPR: RPR Ser Ql: NONREACTIVE

## 2019-10-30 LAB — HEPATITIS C ANTIBODY: Hep C Virus Ab: <0.1 s/co ratio (ref 0.0–0.9)

## 2019-11-01 ENCOUNTER — Other Ambulatory Visit: Payer: Self-pay

## 2019-11-01 LAB — NUSWAB VAGINITIS PLUS (VG+)
Atopobium vaginae: HIGH Score — AB
BVAB 2: HIGH Score — AB
Candida albicans, NAA: POSITIVE — AB
Candida glabrata, NAA: NEGATIVE
Chlamydia trachomatis, NAA: NEGATIVE
Neisseria gonorrhoeae, NAA: NEGATIVE
Trich vag by NAA: NEGATIVE

## 2019-11-01 MED ORDER — METRONIDAZOLE 1 % EX GEL: Freq: Every day | CUTANEOUS | 0 refills | Status: DC

## 2019-11-01 MED ORDER — FLUCONAZOLE 150 MG PO TABS: 150.0000 mg | ORAL_TABLET | Freq: Every day | ORAL | 1 refills | Status: DC

## 2019-11-02 ENCOUNTER — Other Ambulatory Visit: Payer: Self-pay

## 2019-11-02 MED ORDER — METRONIDAZOLE 0.75 % VA GEL: 1.0000 | Freq: Every day | VAGINAL | 0 refills | Status: AC

## 2019-11-05 LAB — IGP, APTIMA HPV, RFX 16/18,45: HPV Aptima: NEGATIVE

## 2019-11-06 ENCOUNTER — Telehealth: Payer: Self-pay

## 2019-11-06 NOTE — Telephone Encounter (Signed)
Called pt to confirm she was able to pick up her Metrogel from pharmacy. - No answer

## 2019-11-06 NOTE — Telephone Encounter (Signed)
Pt called back in and send that she missed a call. I saw the note from the nurse and told her that the nurse called and that the prescription is able to pick up. The pt verbally understood .

## 2019-11-23 ENCOUNTER — Ambulatory Visit (INDEPENDENT_AMBULATORY_CARE_PROVIDER_SITE_OTHER): Payer: Managed Care, Other (non HMO)

## 2019-11-23 ENCOUNTER — Other Ambulatory Visit: Payer: Self-pay

## 2019-11-23 ENCOUNTER — Encounter: Payer: Self-pay | Admitting: Certified Nurse Midwife

## 2019-11-23 ENCOUNTER — Ambulatory Visit (INDEPENDENT_AMBULATORY_CARE_PROVIDER_SITE_OTHER): Payer: Managed Care, Other (non HMO) | Admitting: Certified Nurse Midwife

## 2019-11-23 ENCOUNTER — Other Ambulatory Visit: Payer: Self-pay | Admitting: Certified Nurse Midwife

## 2019-11-23 VITALS — BP 115/76 | HR 80 | Ht 68.0 in | Wt 140.2 lb

## 2019-11-23 DIAGNOSIS — D251 Intramural leiomyoma of uterus: Secondary | ICD-10-CM

## 2019-11-23 DIAGNOSIS — F3281 Premenstrual dysphoric disorder: Secondary | ICD-10-CM

## 2019-11-23 DIAGNOSIS — N926 Irregular menstruation, unspecified: Secondary | ICD-10-CM

## 2019-11-23 NOTE — Patient Instructions (Signed)
Elagolix; Estradiol; Norethindrone acetate oral capsules What is this medicine? ELAGOLIX; ESTRADIOL; NORETHINDRONE (el a GOE lix; es tra DYE ole; nor eth IN drone) is used to treat heavy menstrual bleeding due to uterine fibroids. This medicine may be used for other purposes; ask your health care provider or pharmacist if you have questions. COMMON BRAND NAME(S): ORIAHNN What should I tell my health care provider before I take this medicine? They need to know if you have any of these conditions:  blood vessel disease or blood clots  breast, cervical, endometrial, or uterine cancer  cigarette smoker  diabetes  gallbladder disease  heart disease  high blood pressure  high cholesterol  kidney disease  liver disease  lupus  migraine headaches  mental illness  osteoporosis  porphyria  stroke  suicidal thoughts, plans, or attempt; a previous suicide attempt by you or a family member  unexplained vaginal bleeding  an unusual or allergic reaction to elagolix, estrogens, progestins, other medicines, foods, dyes, or preservatives  pregnant or trying to get pregnant  breast-feeding How should I use this medicine? Take this medicine by mouth with a glass of water. You can take it with or without food. Follow the directions on the prescription label. Take this medicine at the same times each day. Do not take your medicine more often than directed. A special MedGuide will be given to you by the pharmacist with each prescription and refill. Be sure to read this information carefully each time. Talk to your pediatrician regarding the use of this medicine in children. This medicine is not approved for use in children. Overdosage: If you think you have taken too much of this medicine contact a poison control center or emergency room at once. NOTE: This medicine is only for you. Do not share this medicine with others. What if I miss a dose? If you miss a dose, take it within 4  hours of the time that it was supposed to be taken and take the next dose at the usual time. If more than 4 hours have passed, skip the missed dose. The next dose should be taken at the usual time. Do not take double or extra doses. What may interact with this medicine? Do not take this medicine with any of the following medications:  aromatase inhibitors like aminoglutethimide, anastrozole, exemestane, letrozole, testolactone  cyclosporine  gemfibrozil This medicine may also interact with the following medications:  benzodiazepines  bosentan  bromocriptine  certain antivirals for HIV or hepatitis  certain medicines for seizures like carbamazepine, phenobarbital, phenytoin  cimetidine  citalopram  cyclosporine  dantrolene  digoxin  grapefruit juice  griseofulvin  hydrocortisone, cortisone, or prednisolone  isoniazid (INH)  medicines for diabetes  methadone  methotrexate  midazolam  mineral oil  omeprazole  other female hormones, like estrogens or progestins and birth control pills, patches, rings, or injections  raloxifene  rifabutin, rifampin, or rifapentine  rosuvastatin  tamoxifen  thyroid hormones  topiramate  tricyclic antidepressants  warfarin This list may not describe all possible interactions. Give your health care provider a list of all the medicines, herbs, non-prescription drugs, or dietary supplements you use. Also tell them if you smoke, drink alcohol, or use illegal drugs. Some items may interact with your medicine. What should I watch for while using this medicine? Visit your doctor or health care professional for regular checks on your progress. You will need a regular breast and pelvic exam while on this medicine. It may take several cycles of use to  see improvement in your condition. You may have a change in bleeding pattern or irregular periods. Many females stop having periods while taking this drug. Smoking increases the  risk of getting a blood clot or having a stroke while you are taking this medicine, especially if you are more than 34 years old. You are strongly advised not to smoke. This medicine can make your body retain fluid, making your fingers, hands, or ankles swell. Your blood pressure can go up. Contact your doctor or health care professional if you feel you are retaining fluid. This medicine may cause weak bones (osteoporosis). Only use this product for the amount of time your health care professional tells you to. The longer you use this product the more likely you will be at risk for weak bones. Ask your health care professional how you can keep strong bones. This medicine does not prevent pregnancy. Women must use effective birth control with this medicine. Use a non-hormonal form of birth control while taking this medicine and for 1 week after stopping it. Talk to your health care professional about how to prevent pregnancy. Do not become pregnant while taking this medicine. Women should inform their doctor if they wish to become pregnant or think they might be pregnant. There is a potential for serious side effects to an unborn child. Talk to your health care professional or pharmacist for more information. If you are going to have elective surgery, you may need to stop taking this medicine beforehand. Consult your health care professional for advice prior to scheduling the surgery. If you wear contact lenses and notice visual changes, or if the lenses begin to feel uncomfortable, consult your eye care specialist. This medicine does not protect you against HIV infection (AIDS) or any other sexually transmitted diseases. What side effects may I notice from receiving this medicine? Side effects that you should report to your doctor or health care professional as soon as possible:  allergic reactions like skin rash, itching or hives, swelling of the face, lips, or tongue  anxious  breast tissue changes  or discharge  breathing problems  depressed mood  high blood pressure  signs and symptoms of a blood clot such as chest pain; shortness of breath; pain, swelling, or warmth in the leg  signs and symptoms of a stroke like changes in vision; confusion; trouble speaking or understanding; severe headaches; sudden numbness or weakness of the face, arm or leg; trouble walking; dizziness; loss of balance or coordination  signs and symptoms of liver injury like dark yellow or brown urine; general ill feeling or flu-like symptoms; light-colored stools; loss of appetite; nausea; right upper belly pain; unusually weak or tired; yellowing of the eyes or skin  suicidal thoughts or other mood changes  swelling of the ankles, feet, hands  unusual vaginal bleeding Side effects that usually do not require medical attention (report these to your doctor or health care professional if they continue or are bothersome):  reduced or absent menstrual periods  hair loss  headache  hot flashes or night sweats  nausea  tiredness This list may not describe all possible side effects. Call your doctor for medical advice about side effects. You may report side effects to FDA at 1-800-FDA-1088. Where should I keep my medicine? Keep out of the reach of children. Store at room temperature between 15 and 30 degrees C (59 and 86 degrees F). Throw away any unused medicine after the expiration date on the label. NOTE: This sheet is a  summary. It may not cover all possible information. If you have questions about this medicine, talk to your doctor, pharmacist, or health care provider.  2020 Elsevier/Gold Standard (2018-07-03 11:26:16)   Ethinyl Estradiol; Norethindrone Acetate; Ferrous fumarate tablets or capsules What is this medicine? ETHINYL ESTRADIOL; NORETHINDRONE ACETATE; FERROUS FUMARATE (ETH in il es tra DYE ole; nor eth IN drone AS e tate; FER [place] FUE ma rate) is an oral contraceptive. The products  combine two types of female hormones, an estrogen and a progestin. They are used to prevent ovulation and pregnancy. Some products are also used to treat acne in females. This medicine may be used for other purposes; ask your health care provider or pharmacist if you have questions. COMMON BRAND NAME(S): Aurovela [address] 1/20, Aurovela Fe, [place] [address] Fe, [place] Fe, Gildess 24 Fe, Gildess Fe 1.5/30, Gildess Fe 1/20, Hailey 24 Fe, Hailey Fe 1.5/30, Junel Fe 1.5/30, Junel Fe 1/20, Junel Fe 24, Larin Fe, Lo Loestrin Fe, Loestrin 24 Fe, Loestrin FE 1.5/30, Loestrin FE 1/20, L[place] 24 Fe, Microgestin 24 Fe, Microgestin Fe 1.5/30, Microgestin Fe 1/20, Tarina 24 Fe, Tarina Fe 1/20, [place], Tilia Fe, Tri-Legest Fe What should I tell my health care provider before I take this medicine? They need to know if you have any of these conditions:  abnormal vaginal bleeding  blood vessel disease  breast, cervical, endometrial, ovarian, liver, or uterine cancer  diabetes  gallbladder disease  heart disease or recent heart attack  high blood pressure  high cholesterol  history of blood clots  kidney disease  liver disease  migraine headaches  smoke tobacco  stroke  systemic lupus erythematosus (SLE)  an unusual or allergic reaction to estrogens, progestins, other medicines, foods, dyes, or preservatives  pregnant or trying to get pregnant  breast-feeding How should I use this medicine? Take this medicine by mouth. To reduce nausea, this medicine may be taken with food. Follow the directions on the prescription label. Take this medicine at the same time each day and in the order directed on the package. Do not take your medicine more often than directed. A patient package insert for the product will be given with each prescription and refill. Read this sheet carefully each time. The sheet may change frequently. Contact your pediatrician regarding the use of this medicine in  children. Special care may be needed. This medicine has been used in female children who have started having menstrual periods. Overdosage: If you think you have taken too much of this medicine contact a poison control center or emergency room at once. NOTE: This medicine is only for you. Do not share this medicine with others. What if I miss a dose? If you miss a dose, refer to the patient information sheet you received with your medicine for direction. If you miss more than one pill, this medicine may not be as effective and you may need to use another form of birth control. What may interact with this medicine? Do not take this medicine with the following medication:  dasabuvir; ombitasvir; paritaprevir; ritonavir  ombitasvir; paritaprevir; ritonavir This medicine may also interact with the following medications:  acetaminophen  antibiotics or medicines for infections, especially rifampin, rifabutin, rifapentine, and griseofulvin, and possibly penicillins or tetracyclines  aprepitant  ascorbic acid (vitamin C)  atorvastatin  barbiturate medicines, such as phenobarbital  bosentan  carbamazepine  caffeine  clofibrate  cyclosporine  dantrolene  doxercalciferol  felbamate  grapefruit juice  hydrocortisone  medicines for anxiety or sleeping  problems, such as diazepam or temazepam  medicines for diabetes, including pioglitazone  mineral oil  modafinil  mycophenolate  nefazodone  oxcarbazepine  phenytoin  prednisolone  ritonavir or other medicines for HIV infection or AIDS  rosuvastatin  selegiline  soy isoflavones supplements  St. John's wort  tamoxifen or raloxifene  theophylline  thyroid hormones  topiramate  warfarin This list may not describe all possible interactions. Give your health care provider a list of all the medicines, herbs, non-prescription drugs, or dietary supplements you use. Also tell them if you smoke, drink alcohol,  or use illegal drugs. Some items may interact with your medicine. What should I watch for while using this medicine? Visit your doctor or health care professional for regular checks on your progress. You will need a regular breast and pelvic exam and Pap smear while on this medicine. Use an additional method of contraception during the first cycle that you take these tablets. If you have any reason to think you are pregnant, stop taking this medicine right away and contact your doctor or health care professional. If you are taking this medicine for hormone related problems, it may take several cycles of use to see improvement in your condition. Smoking increases the risk of getting a blood clot or having a stroke while you are taking birth control pills, especially if you are more than 34 years old. You are strongly advised not to smoke. This medicine can make your body retain fluid, making your fingers, hands, or ankles swell. Your blood pressure can go up. Contact your doctor or health care professional if you feel you are retaining fluid. This medicine can make you more sensitive to the sun. Keep out of the sun. If you cannot avoid being in the sun, wear protective clothing and use sunscreen. Do not use sun lamps or tanning beds/booths. If you wear contact lenses and notice visual changes, or if the lenses begin to feel uncomfortable, consult your eye care specialist. In some women, tenderness, swelling, or minor bleeding of the gums may occur. Notify your dentist if this happens. Brushing and flossing your teeth regularly may help limit this. See your dentist regularly and inform your dentist of the medicines you are taking. If you are going to have elective surgery, you may need to stop taking this medicine before the surgery. Consult your health care professional for advice. This medicine does not protect you against HIV infection (AIDS) or any other sexually transmitted diseases. What side  effects may I notice from receiving this medicine? Side effects that you should report to your doctor or health care professional as soon as possible:  allergic reactions like skin rash, itching or hives, swelling of the face, lips, or tongue  breast tissue changes or discharge  changes in vaginal bleeding during your period or between your periods  changes in vision  chest pain  confusion  coughing up blood  dizziness  feeling faint or lightheaded  headaches or migraines  leg, arm or groin pain  loss of balance or coordination  severe or sudden headaches  stomach pain (severe)  sudden shortness of breath  sudden numbness or weakness of the face, arm or leg  symptoms of vaginal infection like itching, irritation or unusual discharge  tenderness in the upper abdomen  trouble speaking or understanding  vomiting  yellowing of the eyes or skin Side effects that usually do not require medical attention (report to your doctor or health care professional if they continue or are  bothersome):  breakthrough bleeding and spotting that continues beyond the 3 initial cycles of pills  breast tenderness  mood changes, anxiety, depression, frustration, anger, or emotional outbursts  increased sensitivity to sun or ultraviolet light  nausea  skin rash, acne, or brown spots on the skin  weight gain (slight) This list may not describe all possible side effects. Call your doctor for medical advice about side effects. You may report side effects to FDA at 1-800-FDA-1088. Where should I keep my medicine? Keep out of the reach of children. Store at room temperature between 15 and 30 degrees C (59 and 86 degrees F). Throw away any unused medicine after the expiration date. NOTE: This sheet is a summary. It may not cover all possible information. If you have questions about this medicine, talk to your doctor, pharmacist, or health care provider.  2020 Elsevier/Gold Standard  (2015-09-22 08:04:41)   Uterine Fibroids  Uterine fibroids (leiomyomas) are noncancerous (benign) tumors that can develop in the uterus. Fibroids may also develop in the fallopian tubes, cervix, or tissues (ligaments) near the uterus. You may have one or many fibroids. Fibroids vary in size, weight, and where they grow in the uterus. Some can become quite large. Most fibroids do not require medical treatment. What are the causes? The cause of this condition is not known. What increases the risk? You are more likely to develop this condition if you:  Are in your 30s or 40s and have not gone through menopause.  Have a family history of this condition.  Are of African-American descent.  Had your first period at an early age (early menarche).  Have not had any children (nulliparity).  Are overweight or obese. What are the signs or symptoms? Many women do not have any symptoms. Symptoms of this condition may include:  Heavy menstrual bleeding.  Bleeding or spotting between periods.  Pain and pressure in the pelvic area, between the hips.  Bladder problems, such as needing to urinate urgently or more often than usual.  Inability to have children (infertility).  Failure to carry pregnancy to term (miscarriage). How is this diagnosed? This condition may be diagnosed based on:  Your symptoms and medical history.  A physical exam.  A pelvic exam that includes feeling for any tumors.  Imaging tests, such as ultrasound or MRI. How is this treated? Treatment for this condition may include:  Seeing your health care provider for follow-up visits to monitor your fibroids for any changes.  Taking NSAIDs such as ibuprofen, naproxen, or aspirin to reduce pain.  Hormone medicines. These may be taken as a pill, given in an injection, or delivered by a T-shaped device that is inserted into the uterus (intrauterine device, IUD).  Surgery to remove one of the following: ? The  fibroids (myomectomy). Your health care provider may recommend this if fibroids affect your fertility and you want to become pregnant. ? The uterus (hysterectomy). ? Blood supply to the fibroids (uterine artery embolization). Follow these instructions at home:  Take over-the-counter and prescription medicines only as told by your health care provider.  Ask your health care provider if you should take iron pills or eat more iron-rich foods, such as dark green, leafy vegetables. Heavy menstrual bleeding can cause low iron levels.  If directed, apply heat to your back or abdomen to reduce pain. Use the heat source that your health care provider recommends, such as a moist heat pack or a heating pad. ? Place a towel between your skin and  the heat source. ? Leave the heat on for 20-30 minutes. ? Remove the heat if your skin turns bright red. This is especially important if you are unable to feel pain, heat, or cold. You may have a greater risk of getting burned.  Pay close attention to your menstrual cycle. Tell your health care provider about any changes, such as: ? Increased blood flow that requires you to use more pads or tampons than usual. ? A change in the number of days that your period lasts. ? A change in symptoms that are associated with your period, such as back pain or cramps in your abdomen.  Keep all follow-up visits as told by your health care provider. This is important, especially if your fibroids need to be monitored for any changes. Contact a health care provider if you:  Have pelvic pain, back pain, or cramps in your abdomen that do not get better with medicine or heat.  Develop new bleeding between periods.  Have increased bleeding during or between periods.  Feel unusually tired or weak.  Feel light-headed. Get help right away if you:  Faint.  Have pelvic pain that suddenly gets worse.  Have severe vaginal bleeding that soaks a tampon or pad in 30 minutes or  less. Summary  Uterine fibroids are noncancerous (benign) tumors that can develop in the uterus.  The exact cause of this condition is not known.  Most fibroids do not require medical treatment unless they affect your ability to have children (fertility).  Contact a health care provider if you have pelvic pain, back pain, or cramps in your abdomen that do not get better with medicines.  Make sure you know what symptoms should cause you to get help right away. This information is not intended to replace advice given to you by your health care provider. Make sure you discuss any questions you have with your health care provider. Document Revised: 12/24/2016 Document Reviewed: 12/07/2016 Elsevier Patient Education  2020 [employer].

## 2019-11-23 NOTE — Progress Notes (Signed)
Starting in July been noting increase in anxiety and panic attacks. Thinks that it may be hormone related.

## 2019-11-23 NOTE — Progress Notes (Signed)
GYN ENCOUNTER NOTE  Subjective:       Cynthia Mcintosh is a 34 y.o. G0P0000 female here for ultrasound review and plan of care discussion.   Patient reports hormonal changes all month, headaches, and abdominal pain that worsens during menses.   History of oral contraception use for many years; not sure if she wishes to continue due to increased risk with age.   Questions PMDD and natural treatment options.   Denies difficulty breathing or respiratory distress, chest pain, abdominal pain, excessive vaginal bleeding, dysuria, and leg pain or swelling.    Gynecologic History  Patient's last menstrual period was 10/30/2019 (exact date).  Contraception: NFP  Last Pap: 10/2019. Results were: Neg/Neg  Obstetric History  OB History  Gravida Para Term Preterm AB Living  0 0 0 0 0 0  SAB TAB Ectopic Multiple Live Births  0 0 0 0 0    Past Medical History:  Diagnosis Date  . Anxiety     Past Surgical History:  Procedure Laterality Date  . TONSILECTOMY/ADENOIDECTOMY WITH MYRINGOTOMY      Current Outpatient Medications on File Prior to Visit  Medication Sig Dispense Refill  . ELDERBERRY PO Take by mouth.    Marland Kitchen LORazepam (ATIVAN) 1 MG tablet Take by mouth.    . Multiple Vitamin (MULTI-VITAMIN) tablet Take 1 tablet by mouth daily.    . pantoprazole (PROTONIX) 40 MG tablet Take by mouth.    . fluconazole (DIFLUCAN) 150 MG tablet Take 1 tablet (150 mg total) by mouth daily. May take 1 tablet 3 days later if symptoms not improved 1 tablet 1  . metroNIDAZOLE (METROGEL) 1 % gel Apply topically daily. For 5 nights 45 g 0  . ondansetron (ZOFRAN) 4 MG tablet Take 4 mg by mouth every 8 (eight) hours as needed.    . sertraline (ZOLOFT) 25 MG tablet Take by mouth. (Patient not taking: Reported on 11/23/2019)     No current facility-administered medications on file prior to visit.    No Known Allergies  Social History   Socioeconomic History  . Marital status: Single    Spouse name: Not  on file  . Number of children: Not on file  . Years of education: Not on file  . Highest education level: Not on file  Occupational History  . Not on file  Tobacco Use  . Smoking status: Never Smoker  . Smokeless tobacco: Never Used  Vaping Use  . Vaping Use: Never used  Substance and Sexual Activity  . Alcohol use: Not Currently  . Drug use: Not Currently  . Sexual activity: Yes    Birth control/protection: None  Other Topics Concern  . Not on file  Social History Narrative  . Not on file   Social Determinants of Health   Financial Resource Strain:   . Difficulty of Paying Living Expenses: Not on file  Food Insecurity:   . Worried About Charity fundraiser in the Last Year: Not on file  . Ran Out of Food in the Last Year: Not on file  Transportation Needs:   . Lack of Transportation (Medical): Not on file  . Lack of Transportation (Non-Medical): Not on file  Physical Activity:   . Days of Exercise per Week: Not on file  . Minutes of Exercise per Session: Not on file  Stress:   . Feeling of Stress : Not on file  Social Connections:   . Frequency of Communication with Friends and Family: Not on file  .  Frequency of Social Gatherings with Friends and Family: Not on file  . Attends Religious Services: Not on file  . Active Member of Clubs or Organizations: Not on file  . Attends Archivist Meetings: Not on file  . Marital Status: Not on file  Intimate Partner Violence:   . Fear of Current or Ex-Partner: Not on file  . Emotionally Abused: Not on file  . Physically Abused: Not on file  . Sexually Abused: Not on file    Family History  Problem Relation Age of Onset  . Hypertension Mother   . Hypercholesterolemia Mother   . Hypertension Father   . Thyroid disease Father   . COPD Maternal Grandmother   . Diabetes Maternal Grandfather   . Hypertension Maternal Grandfather   . Stroke Maternal Grandfather     The following portions of the patient's history  were reviewed and updated as appropriate: allergies, current medications, past family history, past medical history, past social history, past surgical history and problem list.  Review of Systems  ROS negative except as noted above. Information obtained from patient.   Objective:   BP 115/76   Pulse 80   Ht 5\' 8"  (1.727 m)   Wt 140 lb 3.2 oz (63.6 kg)   LMP 10/30/2019 (Exact Date)   BMI 21.32 kg/m    CONSTITUTIONAL: Well-developed, well-nourished female in no acute distress.   PHYSICAL EXAM: Not indicated.   ULTRASOUND REPORT  Location: [facility]  Date of Service: 11/23/2019   Indications:Pelvic Pain Findings:  The uterus is anteverted and measures 9.5 x 3.8 x 7.1 cm. Echo texture is heterogenous with evidence of focal masses. Within the uterus are multiple suspected fibroids measuring: Fibroid 1:Posterior SS 4.0 x 4.1 x 3.6 cm  The Endometrium measures 7 mm.  Right Ovary measures 2.2 x 1.7 x 1.8 cm. It is normal in appearance. Left Ovary measures 2.9 x 2.3 x 2.5 cm. It is normal in appearance. Survey of the adnexa demonstrates no adnexal masses. There is no free fluid in the cul de sac.  Impression: 1. Fibroid was noted as described above. 2. No free fluid seen or adnexal masses seen at this time.  Recommendations: 1.Clinical correlation with the patient's History and Physical Exam.   Assessment:   1. Intramural leiomyoma of uterus   2. PMDD (premenstrual dysphoric disorder)   Plan:   Discussed symptoms management options including herbal supplements, hormonal contraception and [place] agonist; handouts provided.   Reviewed red flag symptoms and when to call.   Patient wishes to try herbal supplement for three (3) months and then follow up for hormone labs.   Reviewed red flag symptoms and when to call.   RTC x 3 months for follow up or sooner if needed.    Dani Gobble, CNM [facility], [facility]

## 2019-11-26 ENCOUNTER — Encounter: Payer: Self-pay | Admitting: Certified Nurse Midwife

## 2019-11-26 ENCOUNTER — Other Ambulatory Visit: Payer: Self-pay | Admitting: Certified Nurse Midwife

## 2019-11-26 DIAGNOSIS — D252 Subserosal leiomyoma of uterus: Secondary | ICD-10-CM | POA: Insufficient documentation

## 2019-11-26 DIAGNOSIS — D250 Submucous leiomyoma of uterus: Secondary | ICD-10-CM | POA: Insufficient documentation

## 2019-11-26 DIAGNOSIS — D259 Leiomyoma of uterus, unspecified: Secondary | ICD-10-CM | POA: Insufficient documentation

## 2019-11-26 DIAGNOSIS — D251 Intramural leiomyoma of uterus: Secondary | ICD-10-CM

## 2019-11-26 MED ORDER — LO LOESTRIN FE 1 MG-10 MCG / 10 MCG PO TABS
1.0000 | ORAL_TABLET | Freq: Every day | ORAL | 4 refills | Status: DC
Start: 2019-11-26 — End: 2020-08-01

## 2019-11-26 NOTE — Progress Notes (Signed)
Rx Lo loestrin, see orders.    Dani Gobble, CNM [facility], [facility] 11/26/19 5:41 PM

## 2019-11-29 ENCOUNTER — Encounter: Payer: Managed Care, Other (non HMO) | Admitting: Certified Nurse Midwife

## 2019-12-05 ENCOUNTER — Telehealth: Payer: Self-pay

## 2019-12-05 NOTE — Telephone Encounter (Signed)
Patient called in because she was wanting to have her fibroids removed vaginally. Patient would like to know what the next steps would be to have that completed.  Could you please advise?

## 2019-12-06 ENCOUNTER — Telehealth: Payer: Self-pay

## 2019-12-06 NOTE — Telephone Encounter (Signed)
This patient was instructed to be scheduled with an MD for a fibroidal removal, patient is scheduled to see you on the 23rd however was wanting to know if this appointment could be done as a televisit or would she need to come into the office to see you?  Could you please advise?

## 2019-12-06 NOTE — Telephone Encounter (Signed)
If she is planning on moving forward, it would be best to have her visit in person so that I could do her pre-op exam. If she just wants to discuss her options further, then a televisit will suffice.

## 2019-12-06 NOTE — Telephone Encounter (Signed)
Called pt vm full  ?

## 2019-12-06 NOTE — Telephone Encounter (Signed)
Please contact patient. Advise that vaginal removal is not an option for her as her fibroids are not submucosal. Submucosal fibroids can potentially be removed internally. Intramural and subserosal fibroids can only be removed externally, using a hysterscope. Her fibroid is intramural, she may schedule an appointment with Dr. Marcelline Mates to discuss further if desired. Thanks, JML

## 2019-12-07 NOTE — Telephone Encounter (Signed)
Called patient to inform her of what you suggested, was unable to speak directly with patient lvm informing her of what you had suggested, and told patient to call the office back if she had any questions or concerns.

## 2019-12-18 ENCOUNTER — Encounter: Payer: Managed Care, Other (non HMO) | Admitting: Obstetrics and Gynecology

## 2019-12-27 ENCOUNTER — Encounter: Payer: Self-pay | Admitting: Obstetrics and Gynecology

## 2019-12-27 ENCOUNTER — Ambulatory Visit (INDEPENDENT_AMBULATORY_CARE_PROVIDER_SITE_OTHER): Payer: Managed Care, Other (non HMO) | Admitting: Obstetrics and Gynecology

## 2019-12-27 ENCOUNTER — Other Ambulatory Visit: Payer: Self-pay

## 2019-12-27 VITALS — BP 104/65 | HR 123 | Ht 68.0 in | Wt 147.3 lb

## 2019-12-27 DIAGNOSIS — F3281 Premenstrual dysphoric disorder: Secondary | ICD-10-CM

## 2019-12-27 DIAGNOSIS — D252 Subserosal leiomyoma of uterus: Secondary | ICD-10-CM

## 2019-12-27 NOTE — Progress Notes (Signed)
Pt present to discuss fibroid removal. Pt stated that she would like to discuss options for her fibroid issues.

## 2019-12-28 ENCOUNTER — Encounter: Payer: Self-pay | Admitting: Obstetrics and Gynecology

## 2019-12-28 DIAGNOSIS — F3281 Premenstrual dysphoric disorder: Secondary | ICD-10-CM | POA: Insufficient documentation

## 2019-12-28 NOTE — Progress Notes (Signed)
GYNECOLOGY PROGRESS NOTE  Subjective:    Patient ID: Cynthia Mcintosh, female    DOB: Nov 15, 1985, 34 y.o.   MRN: [id number]  HPI  Patient is a 34 y.o. G0P0000 female who presents for for consultation of fibroid removal. She is a patient of Dani Gobble, CNM. She also has a history of PMDD and associated anxiety.  She has a history of debilitating cramps, migraine headaches, back pain, bloating, breast tenderness, frequent urination and pelvic pressure during ovulation as well as right before her cycle starts. Has been modifying diet for several years and has tried herbal supplements which have only provided partial relief.  She was prescribed Zoloft for her PMDD and Ativan for her anxiety which have helped some. She was also prescribed a low-dose birth control pill (Lo-Loestrin) for better management of her hormonal symptoms over 1 month ago, however notes she has not started taking it yet as she desired to review her options again. Has used OCPs in her teen years which she notes did help her periods. She notes she has heard of other options for her fibroids and desires to see if anything else will help.    The following portions of the patient's history were reviewed and updated as appropriate:   She  has a past medical history of Anxiety.   She  has a past surgical history that includes Tonsilectomy/adenoidectomy with myringotomy.   Her family history includes COPD in her maternal grandmother; Diabetes in her maternal grandfather; Hypercholesterolemia in her mother; Hypertension in her father, maternal grandfather, and mother; Stroke in her maternal grandfather; Thyroid disease in her father.   She  reports that she has never smoked. She has never used smokeless tobacco. She reports previous alcohol use. She reports previous drug use.   She has a current medication list which includes the following prescription(s): lorazepam, multi-vitamin, ondansetron, pantoprazole, lo loestrin fe, and  sertraline.   She has No Known Allergies..  Review of Systems Pertinent items noted in HPI and remainder of comprehensive ROS otherwise negative.   Objective:   Blood pressure 104/65, pulse (!) 123, height 5\' 8"  (1.727 m), weight 147 lb 4.8 oz (66.8 kg), last menstrual period 12/20/2019. General appearance: alert and no distress Remainder of exam deferred.    Imaging:  US PELVIS (TRANSABDOMINAL ONLY) Patient Name: Cynthia Mcintosh DOB: Oct 28, 1985 MRN: [id number] ULTRASOUND REPORT  Location: [facility] Date of Service: 11/23/2019   Indications:Pelvic Pain Findings:  The uterus is anteverted and measures 9.5 x 3.8 x 7.1 cm. Echo texture is heterogenous with evidence of focal masses. Within the uterus are multiple suspected fibroids measuring: Fibroid 1:Posterior SS 4.0 x 4.1 x 3.6 cm  The Endometrium measures 7 mm.  Right Ovary measures 2.2 x 1.7 x 1.8 cm. It is normal in appearance. Left Ovary measures 2.9 x 2.3 x 2.5 cm. It is normal in appearance. Survey of the adnexa demonstrates no adnexal masses. There is no free fluid in the cul de sac.  Impression: 1. Fibroid was noted as described above. 2. No free fluid seen or adnexal masses seen at this time.  Recommendations: 1.Clinical correlation with the patient's History and Physical Exam.  Jenine M. Albertine Grates    RDMS  I have reviewed this study and agree with documented findings.   Rubie Maid, MD [facility]    Assessment:   1. Fibroids, subserous   2. PMDD (premenstrual dysphoric disorder)     Plan:   1. Lengthy discussion had with patient  about her fibroids and PMDD symptoms.  With regards to her fibroids, I discussed that due to the location of her fibroid it was the likely cause of her cramping and heavy periods, however it could cause some pressure symptoms more specifically in the rectal area as the fibroid is located posteriorly.  It is possible that the fibroid could push the uterus  forward and could lead to some pressure symptoms on the bladder as well.  Discussed options for removal of the fibroid, advised that on myomectomy, likely would be laparoscopic.  Patient inquires about you UFE or radiofrequency ablation.  Discussed that UFE may or may not give her the best results due to location and current fibroid and was usually used more for issues with bleeding, however she could have a consultation to further discuss either option if she desired.  Patient also inquired into Lupron therapy, discussed that this is typically used prior to planned surgical intervention as when she stopped the medication it is possible for the fibroids to continue to grow, and use is for short-term up to 6 months.  I advised if she was concerned about her menstrual cycle as well as her fibroids she could consider the use of Depo-Provera as it has been known to sometimes reduce the volume of fibroids, however patient notes concerns about weight gain and delay in resumption of her cycles if she were to initiate.  Lastly discussed the usage of other oral GnRH agonist medication such as my [place].  Patient states that she does not desire future childbearing, however is not ready to consider definitive surgical management with hysterectomy.  Patient notes that she well try the birth control and see if it helps, and if it does not she will return for further discussion of management of her fibroid.   2.  PMDD symptoms -Incorporated in the discussion of her fibroids we also discussed how the use of hormones could help with her management of PMDD symptoms.  Discussed how the menstrual cycle occurs with regards to fluctuation of hormones at different points in the cycle.  Advised that hormonal control could be gained with use of the birth control.  She also already currently has Zoloft prescribed for her which she states she is taking.  Her anxiety is currently being treated with Ativan as needed during her cycle, as she  typically has more panic attacks around this time.  Advised to give the birth control at least 3 months, and can reassess symptoms at that time.  Patient can follow-up with either midwife or MD.  A total of 15 minutes were spent face-to-face with the patient during this encounter and over half of that time dealt with counseling and coordination of care.   Rubie Maid, MD [facility]

## 2019-12-28 NOTE — Patient Instructions (Signed)
Uterine Fibroids  Uterine fibroids are lumps of tissue (tumors) in your womb (uterus). They are not cancer (are benign). Most women with this condition do not need treatment. Sometimes fibroids can affect your ability to have children (your fertility). If that happens, you may need surgery to take out the fibroids. Follow these instructions at home:  Take over-the-counter and prescription medicines only as told by your doctor. Your doctor may suggest NSAIDs (such as aspirin or ibuprofen) to help with pain.  Ask your doctor if you should: ? Take iron pills. ? Eat more foods that have iron in them, such as dark green, leafy vegetables.  If directed, apply heat to your back or belly to reduce pain. Use the heat source that your doctor recommends, such as a moist heat pack or a heating pad. ? Put a towel between your skin and the heat source. ? Leave the heat on for 20-30 minutes. ? Remove the heat if your skin turns bright red. This is especially important if you are unable to feel pain, heat, or cold. You may have a greater risk of getting burned.  Pay close attention to your period (menstrual) cycles. Tell your doctor about any changes, such as: ? A heavier blood flow than usual. ? Needing to use more pads or tampons than normal. ? A change in how many days your period lasts. ? A change in symptoms that come with your period, such as cramps or back pain.  Keep all follow-up visits as told by your doctor. This is important. Your doctor may need to watch your fibroids over time for any changes. Contact a doctor if you:  Have pain that does not get better with medicine or heat, such as pain or cramps in: ? Your back. ? The area between your hip bones (pelvic area). ? Your belly.  Have new bleeding between your periods.  Have more bleeding during or between your periods.  Feel very tired or weak.  Feel light-headed. Get help right away if you:  Pass out (faint).  Have pain in the  area between your hip bones that suddenly gets worse.  Have bleeding that soaks a tampon or pad in 30 minutes or less. Summary  Uterine fibroids are lumps of tissue (tumors) in your womb (uterus). They are not cancer.  The only treatment that most women need is taking aspirin or ibuprofen for pain.  Contact a doctor if you have pain or cramps that do not get better with medicine.  Make sure you know what symptoms you should get help for right away. This information is not intended to replace advice given to you by your health care provider. Make sure you discuss any questions you have with your health care provider. Document Revised: 12/24/2016 Document Reviewed: 12/07/2016 Elsevier Patient Education  [place].  Premenstrual Syndrome Premenstrual syndrome (PMS) is a group of physical, emotional, and behavioral symptoms that affect women of childbearing age as part of their menstrual cycle. PMS starts 1-2 weeks before the start of a woman's menstrual period and goes away a few days after menstrual bleeding starts. It often happens in a predictable pattern (recurs). PMS may cause other health conditions to become worse, such as asthma, allergies, and migraines. PMS can range from mild to severe. When it is severe, it is called premenstrual dysphoric disorder (PMDD). PMS may interfere with normal daily activities. What are the causes? The cause of this condition is not known, but it seems to be related  to hormone changes that happen before menstruation. What are the signs or symptoms? Symptoms of this condition often happen every month. They go away completely after your period starts. Physical symptoms of this condition include:  Bloating.  Breast pain.  Headaches.  Extreme fatigue.  Backaches.  Swelling of the hands and feet.  Weight gain.  Hot flashes. Emotional and behavioral symptoms of this condition include:  Mood swings.  Depression.  Angry outbursts.   Irritability.  Anxiety.  Crying spells.  Food cravings or appetite changes.  Changes in sexual desire.  Confusion.  Aggression.  Social withdrawal.  Poor concentration. How is this diagnosed? This condition may be diagnosed based on a history of your symptoms. This condition is generally diagnosed if symptoms of PMS:  Are present in the 5 days before your period starts.  End within 4 days after your period starts.  Happen at least 3 months in a row.  Interfere with some of your normal activities. Other conditions that can cause some of these symptoms must be ruled out before PMS can be diagnosed. These include depression, anxiety, anemia, and thyroid problems. How is this treated? This condition may be treated by:  Maintaining a healthy lifestyle. This includes eating a well-balanced diet and exercising regularly.  Taking medicines. Medicines can help relieve symptoms such as cramps, aches, pains, headaches, and breast tenderness. Depending on the severity of the condition, your health care provider may recommend various over-the-counter pain medicines. Follow these instructions at home: Eating and drinking   Eat a well-balanced diet.  Avoid caffeine and alcohol.  Limit the amount of salt and salty foods you eat. This will help reduce bloating.  Drink enough fluid to keep your urine pale yellow.  Take a multivitamin if told to do so by your health care provider. Lifestyle   Do not use any products that contain nicotine or tobacco, such as cigarettes, e-cigarettes, and chewing tobacco. If you need help quitting, ask your health care provider.  Exercise regularly as suggested by your health care provider.  Get enough sleep. For most adults, this is 7-8 hours of sleep each night.  Practice relaxation techniques such as yoga, tai chi, or meditation.  Find healthy ways to manage stress. General instructions   For 2-3 months, write down your symptoms, their  severity, and how long they last. This will help your health care provider choose the best treatment for you.  Take over-the-counter and prescription medicines only as told by your health care provider.  If you are using birth control pills (oral contraceptives), use them as told by your health care provider. Contact a health care provider if:  Your symptoms get worse.  You develop new symptoms.  You have trouble doing your daily activities. Summary  Premenstrual syndrome (PMS) is a group of physical, emotional, and behavioral symptoms that affect women of childbearing age.  PMS starts 1-2 weeks before the start of a woman's period and goes away a few days after the period starts.  PMS is treated by maintaining a healthy lifestyle and taking medicines to relieve the symptoms. This information is not intended to replace advice given to you by your health care provider. Make sure you discuss any questions you have with your health care provider. Document Revised: 08/24/2017 Document Reviewed: 08/24/2017 Elsevier Patient Education  [place].

## 2020-01-02 ENCOUNTER — Encounter: Payer: Managed Care, Other (non HMO) | Admitting: Obstetrics and Gynecology

## 2020-02-29 ENCOUNTER — Encounter: Payer: Managed Care, Other (non HMO) | Admitting: Certified Nurse Midwife

## 2020-03-06 ENCOUNTER — Encounter: Payer: Self-pay | Admitting: Certified Nurse Midwife

## 2020-03-06 ENCOUNTER — Other Ambulatory Visit: Payer: Self-pay

## 2020-03-06 ENCOUNTER — Ambulatory Visit: Payer: Managed Care, Other (non HMO) | Admitting: Certified Nurse Midwife

## 2020-03-06 VITALS — BP 132/84 | HR 90 | Ht 68.0 in | Wt 151.1 lb

## 2020-03-06 DIAGNOSIS — Z113 Encounter for screening for infections with a predominantly sexual mode of transmission: Secondary | ICD-10-CM

## 2020-03-06 DIAGNOSIS — D252 Subserosal leiomyoma of uterus: Secondary | ICD-10-CM | POA: Diagnosis not present

## 2020-03-06 DIAGNOSIS — F3281 Premenstrual dysphoric disorder: Secondary | ICD-10-CM

## 2020-03-06 DIAGNOSIS — Z3041 Encounter for surveillance of contraceptive pills: Secondary | ICD-10-CM | POA: Diagnosis not present

## 2020-03-06 DIAGNOSIS — N644 Mastodynia: Secondary | ICD-10-CM

## 2020-03-06 NOTE — Progress Notes (Signed)
GYN ENCOUNTER NOTE  Subjective:       Cynthia Mcintosh is a 35 y.o. G0P0000 female is here for follow up visit regarding PMDD and menses management.   Last seen in office by Dr. Marcelline Mates on 12/27/2019; for further details of this visit, please see previous note.   Patient taking Lo Loestrin daily, notes spotting only with mild cramping and breast tenderness (requests exam). In the middle of third pack, wishes to continue.   Request labs to check hormone levels in addition to STI testing.   Denies difficulty breathing or respiratory distress, chest pain, abdominal pain, excessive vaginal bleeding, dysuria, and leg pain or swelling.    Gynecologic History  No LMP recorded (lmp unknown).   Contraception: OCP (estrogen/progesterone), Lo Loestrin  Last Pap: 10/2019. Results were: Neg/Neg  Obstetric History  OB History  Gravida Para Term Preterm AB Living  0 0 0 0 0 0  SAB IAB Ectopic Multiple Live Births  0 0 0 0 0    Past Medical History:  Diagnosis Date  . Anxiety   . COVID     Past Surgical History:  Procedure Laterality Date  . TONSILECTOMY/ADENOIDECTOMY WITH MYRINGOTOMY      Current Outpatient Medications on File Prior to Visit  Medication Sig Dispense Refill  . LORazepam (ATIVAN) 1 MG tablet Take by mouth.    . Multiple Vitamin (MULTI-VITAMIN) tablet Take 1 tablet by mouth daily.    . Norethindrone-Ethinyl Estradiol-Fe Biphas (LO LOESTRIN FE) 1 MG-10 MCG / 10 MCG tablet Take 1 tablet by mouth daily. 84 tablet 4   No current facility-administered medications on file prior to visit.    No Known Allergies  Social History   Socioeconomic History  . Marital status: Single    Spouse name: Not on file  . Number of children: Not on file  . Years of education: Not on file  . Highest education level: Not on file  Occupational History  . Not on file  Tobacco Use  . Smoking status: Never Smoker  . Smokeless tobacco: Never Used  Vaping Use  . Vaping Use: Never used   Substance and Sexual Activity  . Alcohol use: Not Currently  . Drug use: Not Currently  . Sexual activity: Yes    Birth control/protection: Pill  Other Topics Concern  . Not on file  Social History Narrative  . Not on file   Social Determinants of Health   Financial Resource Strain: Not on file  Food Insecurity: Not on file  Transportation Needs: Not on file  Physical Activity: Not on file  Stress: Not on file  Social Connections: Not on file  Intimate Partner Violence: Not on file    Family History  Problem Relation Age of Onset  . Hypertension Mother   . Hypercholesterolemia Mother   . Hypertension Father   . Thyroid disease Father   . COPD Maternal Grandmother   . Diabetes Maternal Grandfather   . Hypertension Maternal Grandfather   . Stroke Maternal Grandfather     The following portions of the patient's history were reviewed and updated as appropriate: allergies, current medications, past family history, past medical history, past social history, past surgical history and problem list.  Review of Systems  ROS negative except as noted above. Information obtained from patient.   Objective:   BP 132/84   Pulse 90   Ht 5\' 8"  (1.727 m)   Wt 151 lb 2 oz (68.5 kg)   LMP  (LMP Unknown)  BMI 22.98 kg/m   CONSTITUTIONAL: Well-developed, well-nourished female in no acute distress.   BREASTS: breasts appear normal, no suspicious masses, no skin or nipple changes or axillary nodes.  MUSCULOSKELETAL: Normal range of motion. No tenderness.  No cyanosis, clubbing, or edema.  Assessment:   1. Fibroids, subserous   2. PMDD (premenstrual dysphoric disorder)  - TSH - FSH/LH - Estradiol - Progesterone - Testosterone, Free, Total, SHBG  3. Routine screening for STI (sexually transmitted infection)  - Chlamydia/Gonococcus/Trichomonas, NAA - RPR - HIV Antibody (routine testing w rflx) - Viral Hepatitis HBV, HCV  4. Surveillance for birth control, oral  contraceptives   5. Breast tenderness  - FSH/LH - Estradiol - Progesterone     Plan:   Labs today, see orders.   Continue Lo Loestrin as prescribed.   Home measures for management of breast tenderness sent via MyChart.   Reviewed red flag symptoms and when to call.   RTC x 8 months for ANNUAL EXAM or sooner if needed.    Dani Gobble, CNM [facility], [facility] 03/06/20 3:10 PM

## 2020-03-06 NOTE — Patient Instructions (Signed)
Premenstrual Syndrome Premenstrual syndrome (PMS) is a group of physical, emotional, and behavioral symptoms that affect women as part of their menstrual cycle. PMS occurs 1-2 weeks before the start of a woman's menstrual period and goes away a few days after menstrual bleeding begins. PMS can range from mild to severe. What are the causes? The exact cause of this condition is not known, but it seems to be related to hormone changes that happen before menstruation. What are the signs or symptoms? Symptoms of this condition often happen every month. They go away after your period starts. Physical symptoms of this condition include:  Bloating.  Breast pain or tenderness.  Headaches.  Extreme fatigue.  Backaches.  Swelling of the hands and feet.  Weight gain.  Hot flashes. Emotional symptoms of this condition include:  Mood swings.  Depression.  Angry or hostile outbursts.  Irritability.  Anxiety.  Crying spells. Behavioral symptoms include:  Food cravings or appetite changes.  Changes in sexual desire.  Confusion.  Social withdrawal.  Poor concentration. How is this diagnosed? This condition may be diagnosed based on a history of your symptoms. This condition is generally diagnosed if symptoms of PMS:  Are present in the 5 days before your period starts.  End within 4 days after your period starts.  Happen at least 3 months in a row.  Interfere with some of your normal activities. Other conditions that can cause some of these symptoms must be ruled out before PMS can be diagnosed. These include depression, anxiety, anemia, and thyroid problems. How is this treated? This condition may be treated by doing the following:  Maintaining a healthy lifestyle. This includes eating a well-balanced diet and exercising regularly.  Taking over-the-counter medicines that can help relieve symptoms, such as cramps, aches, pain, headaches, and breast tenderness. Follow  these instructions at home: Eating and drinking  Eat a well-balanced diet.  Avoid caffeine and alcohol.  Limit the amount of salt and salty foods you eat. This will help reduce bloating.  Drink enough fluid to keep your urine pale yellow.  Take a multivitamin if told to do so by your health care provider.   Lifestyle  Do not use any products that contain nicotine or tobacco. These products include cigarettes, chewing tobacco, and vaping devices, such as e-cigarettes. If you need help quitting, ask your health care provider.  Exercise regularly as suggested by your health care provider.  Get enough sleep. For most adults, this is 7-8 hours of sleep each night.  Practice relaxation techniques, such as yoga, tai chi, or meditation.  Find healthy ways to manage stress.   General instructions  For 2-3 months, write down your symptoms, whether they are mild to severe, and how long they last. This will help your health care provider choose the best treatment for you.  Take over-the-counter and prescription medicines only as told by your health care provider.  If you are using birth control pills (oral contraceptives), use them as told by your health care provider.   Contact a health care provider if:  Your symptoms get worse.  You develop new symptoms.  You have trouble doing your daily activities. Summary  Premenstrual syndrome (PMS) is a group of physical, emotional, and behavioral symptoms that affect women as part of their menstrual cycle.  PMS starts 1-2 weeks before the start of a woman's period and goes away a few days after the period starts.  PMS is treated by maintaining a healthy lifestyle and taking medicines  to relieve the symptoms. This information is not intended to replace advice given to you by your health care provider. Make sure you discuss any questions you have with your health care provider. Document Revised: 08/31/2019 Document Reviewed:  08/31/2019 Elsevier Patient Education  2021 [employer].

## 2020-03-07 LAB — TSH: TSH: 1.87 u[IU]/mL (ref 0.450–4.500)

## 2020-03-07 LAB — VIRAL HEPATITIS HBV, HCV
HCV Ab: 0.1 s/co ratio (ref 0.0–0.9)
Hep B Core Total Ab: NEGATIVE
Hep B Surface Ab, Qual: REACTIVE
Hepatitis B Surface Ag: NEGATIVE

## 2020-03-07 LAB — TESTOSTERONE, FREE, TOTAL, SHBG
Sex Hormone Binding: 220 nmol/L — ABNORMAL HIGH (ref 24.6–122.0)
Testosterone, Free: 2.3 pg/mL (ref 0.0–4.2)
Testosterone: 42 ng/dL (ref 8–60)

## 2020-03-07 LAB — PROGESTERONE: Progesterone: 0.1 ng/mL

## 2020-03-07 LAB — HIV ANTIBODY (ROUTINE TESTING W REFLEX): HIV Screen 4th Generation wRfx: NONREACTIVE

## 2020-03-07 LAB — RPR: RPR Ser Ql: NONREACTIVE

## 2020-03-07 LAB — HCV INTERPRETATION

## 2020-03-07 LAB — FSH/LH
FSH: 4.2 m[IU]/mL
LH: 8.5 m[IU]/mL

## 2020-03-07 LAB — ESTRADIOL: Estradiol: 70.5 pg/mL

## 2020-03-08 LAB — CHLAMYDIA/GONOCOCCUS/TRICHOMONAS, NAA
Chlamydia by NAA: NEGATIVE
Gonococcus by NAA: NEGATIVE
Trich vag by NAA: NEGATIVE

## 2020-03-11 NOTE — Telephone Encounter (Signed)
Please have Mia check if CMP can be added on collected labs. Thanks, JML

## 2020-03-13 LAB — COMPREHENSIVE METABOLIC PANEL
ALT: 13 IU/L (ref 0–32)
AST: 18 IU/L (ref 0–40)
Albumin/Globulin Ratio: 1.3 (ref 1.2–2.2)
Albumin: 4.4 g/dL (ref 3.8–4.8)
Alkaline Phosphatase: 39 IU/L — ABNORMAL LOW (ref 44–121)
BUN/Creatinine Ratio: 10 (ref 9–23)
BUN: 8 mg/dL (ref 6–20)
Bilirubin Total: <0.2 mg/dL (ref 0.0–1.2)
CO2: 18 mmol/L — ABNORMAL LOW (ref 20–29)
Calcium: 9.6 mg/dL (ref 8.7–10.2)
Chloride: 102 mmol/L (ref 96–106)
Creatinine, Ser: 0.81 mg/dL (ref 0.57–1.00)
GFR calc Af Amer: 110 mL/min/{1.73_m2} (ref >59)
GFR calc non Af Amer: 95 mL/min/{1.73_m2} (ref >59)
Globulin, Total: 3.5 g/dL (ref 1.5–4.5)
Glucose: 83 mg/dL (ref 65–99)
Potassium: 4 mmol/L (ref 3.5–5.2)
Sodium: 138 mmol/L (ref 134–144)
Total Protein: 7.9 g/dL (ref 6.0–8.5)

## 2020-03-13 LAB — SPECIMEN STATUS REPORT

## 2020-05-03 ENCOUNTER — Emergency Department: Payer: Managed Care, Other (non HMO)

## 2020-05-03 ENCOUNTER — Emergency Department
Admission: EM | Admit: 2020-05-03 | Discharge: 2020-05-03 | Disposition: A | Discharge status: Home / Self Care | Payer: Managed Care, Other (non HMO) | Attending: Emergency Medicine | Admitting: Emergency Medicine

## 2020-05-03 ENCOUNTER — Other Ambulatory Visit: Payer: Self-pay

## 2020-05-03 DIAGNOSIS — R0602 Shortness of breath: Secondary | ICD-10-CM

## 2020-05-03 DIAGNOSIS — R0789 Other chest pain: Secondary | ICD-10-CM

## 2020-05-03 DIAGNOSIS — Z20822 Contact with and (suspected) exposure to covid-19: Secondary | ICD-10-CM | POA: Diagnosis not present

## 2020-05-03 DIAGNOSIS — Z8616 Personal history of COVID-19: Secondary | ICD-10-CM | POA: Insufficient documentation

## 2020-05-03 DIAGNOSIS — J4521 Mild intermittent asthma with (acute) exacerbation: Secondary | ICD-10-CM | POA: Diagnosis not present

## 2020-05-03 DIAGNOSIS — R000 Tachycardia, unspecified: Secondary | ICD-10-CM | POA: Diagnosis not present

## 2020-05-03 LAB — CBC WITH DIFFERENTIAL/PLATELET
Abs Immature Granulocytes: 0.07 10*3/uL (ref 0.00–0.07)
Basophils Absolute: 0 10*3/uL (ref 0.0–0.1)
Basophils Relative: 0 %
Eosinophils Absolute: 0.1 10*3/uL (ref 0.0–0.5)
Eosinophils Relative: 1 %
HCT: 43.1 % (ref 36.0–46.0)
Hemoglobin: 14.3 g/dL (ref 12.0–15.0)
Immature Granulocytes: 1 %
Lymphocytes Relative: 25 %
Lymphs Abs: 2.3 10*3/uL (ref 0.7–4.0)
MCH: 31.2 pg (ref 26.0–34.0)
MCHC: 33.2 g/dL (ref 30.0–36.0)
MCV: 93.9 fL (ref 80.0–100.0)
Monocytes Absolute: 0.6 10*3/uL (ref 0.1–1.0)
Monocytes Relative: 7 %
Neutro Abs: 6.1 10*3/uL (ref 1.7–7.7)
Neutrophils Relative %: 66 %
Platelets: 283 10*3/uL (ref 150–400)
RBC: 4.59 MIL/uL (ref 3.87–5.11)
RDW: 12.5 % (ref 11.5–15.5)
WBC: 9.1 10*3/uL (ref 4.0–10.5)
nRBC: 0 % (ref 0.0–0.2)

## 2020-05-03 LAB — COMPREHENSIVE METABOLIC PANEL
ALT: 19 U/L (ref 0–44)
AST: 25 U/L (ref 15–41)
Albumin: 4.4 g/dL (ref 3.5–5.0)
Alkaline Phosphatase: 34 U/L — ABNORMAL LOW (ref 38–126)
Anion gap: 9 (ref 5–15)
BUN: 13 mg/dL (ref 6–20)
CO2: 23 mmol/L (ref 22–32)
Calcium: 9.6 mg/dL (ref 8.9–10.3)
Chloride: 106 mmol/L (ref 98–111)
Creatinine, Ser: 0.84 mg/dL (ref 0.44–1.00)
GFR, Estimated: >60 mL/min (ref >60)
Glucose, Bld: 99 mg/dL (ref 70–99)
Potassium: 3.6 mmol/L (ref 3.5–5.1)
Sodium: 138 mmol/L (ref 135–145)
Total Bilirubin: 0.6 mg/dL (ref 0.3–1.2)
Total Protein: 9.2 g/dL — ABNORMAL HIGH (ref 6.5–8.1)

## 2020-05-03 LAB — D-DIMER, QUANTITATIVE: D-Dimer, Quant: 0.49 ug/mL-FEU (ref 0.00–0.50)

## 2020-05-03 LAB — SARS CORONAVIRUS 2 (TAT 6-24 HRS): SARS Coronavirus 2: NEGATIVE

## 2020-05-03 LAB — TROPONIN I (HIGH SENSITIVITY): Troponin I (High Sensitivity): <2 ng/L (ref <18)

## 2020-05-03 MED ORDER — IPRATROPIUM-ALBUTEROL 0.5-2.5 (3) MG/3ML IN SOLN
3.0000 mL | Freq: Once | RESPIRATORY_TRACT | Status: AC
  Administered 2020-05-03: 3 mL via RESPIRATORY_TRACT
  Filled 2020-05-03: qty 3

## 2020-05-03 MED ORDER — ALBUTEROL SULFATE HFA 108 (90 BASE) MCG/ACT IN AERS: 2.0000 | INHALATION_SPRAY | Freq: Four times a day (QID) | RESPIRATORY_TRACT | 2 refills | Status: AC | PRN

## 2020-05-03 MED ORDER — GUAIFENESIN ER 600 MG PO TB12
1200.0000 mg | ORAL_TABLET | Freq: Once | ORAL | Status: AC
  Administered 2020-05-03: 1200 mg via ORAL
  Filled 2020-05-03: qty 2

## 2020-05-03 NOTE — ED Notes (Addendum)
Patient transported to XR. 

## 2020-05-03 NOTE — ED Provider Notes (Signed)
[facility] Emergency Department Provider Note ____________________________________________   Event Date/Time   First MD Initiated Contact with Patient 05/03/20 1143     (approximate)  I have reviewed the triage vital signs and the nursing notes.  HISTORY  Chief Complaint Shortness of Breath and Chest Pain   HPI Cynthia Mcintosh is a 35 y.o. femalewho presents to the ED for evaluation of shortness of breath.  Chart review indicates history of anxiety and subserous uterine fibroids.  Migraines and panic attacks.  She self-reports a history of asthma for which she has albuterol inhaler, which expired last month and she has therefore not used.   Patient presents to the ED for evaluation of 2 days of intermittent chest pressure and shortness of breath in the setting of upper respiratory congestion.  Patient reports that she has been congested for a couple days, and while at work today she developed rapid onset shortness of breath and chest pressure.  She reports her albuterol inhaler was expired and so she did not use it.  Due to the increasing shortness of breath and chest pressure, she called 911 and was transported here.  She was concerned that her symptoms are due to anxiety as she self describes her self as anxious person.  Denies syncopal episodes.  She does report being on OCPs.  Denies recent illnesses, steroids or antibiotics.   Past Medical History:  Diagnosis Date  . Anxiety   . COVID     Patient Active Problem List   Diagnosis Date Noted  . PMDD (premenstrual dysphoric disorder) 12/28/2019  . Fibroids, subserous 11/26/2019  . Allergic rhinitis 08/13/2019  . Migraine variant 08/13/2019  . Paresthesias 08/13/2019  . Postural dizziness with presyncope 08/13/2019  . Prediabetes 08/13/2019  . 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 Panic attacks 07/31/2019  . Menstrual cycle disorder 04/17/2019  . Situational anxiety 03/26/2010    Past  Surgical History:  Procedure Laterality Date  . TONSILECTOMY/ADENOIDECTOMY WITH MYRINGOTOMY      Prior to Admission medications   Medication Sig Start Date End Date Taking? Authorizing Provider  albuterol (VENTOLIN HFA) 108 (90 Base) MCG/ACT inhaler Inhale 2 puffs into the lungs every 6 (six) hours as needed for wheezing or shortness of breath. 05/03/20  Yes Vladimir Crofts, MD  LORazepam (ATIVAN) 1 MG tablet Take by mouth. 10/26/19   [provider]  Multiple Vitamin (MULTI-VITAMIN) tablet Take 1 tablet by mouth daily.    [provider]  Norethindrone-Ethinyl Estradiol-Fe Biphas (LO LOESTRIN FE) 1 MG-10 MCG / 10 MCG tablet Take 1 tablet by mouth daily. 11/26/19   Diona Fanti, CNM    Allergies Patient has no known allergies.  Family History  Problem Relation Age of Onset  . Hypertension Mother   . Hypercholesterolemia Mother   . Hypertension Father   . Thyroid disease Father   . COPD Maternal Grandmother   . Diabetes Maternal Grandfather   . Hypertension Maternal Grandfather   . Stroke Maternal Grandfather     Social History Social History   Tobacco Use  . Smoking status: Never Smoker  . Smokeless tobacco: Never Used  Vaping Use  . Vaping Use: Never used  Substance Use Topics  . Alcohol use: Not Currently  . Drug use: Not Currently    Review of Systems  Constitutional: No fever/chills Eyes: No visual changes. ENT: No sore throat. Cardiovascular: Positive for chest pain. Respiratory: Positive shortness of breath Gastrointestinal: No abdominal pain.  No nausea, no vomiting.  No diarrhea.  No constipation. Genitourinary: Negative for dysuria. Musculoskeletal: Negative for back pain. Skin: Negative for rash. Neurological: Negative for headaches, focal weakness or numbness.  ____________________________________________   PHYSICAL EXAM:  VITAL SIGNS: Vitals:   05/03/20 1315 05/03/20 1343  BP:  134/86  Pulse: 95 94  Resp: 20 16   Temp:    SpO2: 97% 100%    Constitutional: Alert and oriented. Well appearing and in no acute distress.  Speaking in full sentences Eyes: Conjunctivae are normal. PERRL. EOMI. Head: Atraumatic. Nose: Upper respiratory congestion and clear rhinorrhea noted.   Mouth/Throat: Mucous membranes are moist.  Oropharynx non-erythematous. Neck: No stridor. No cervical spine tenderness to palpation. Cardiovascular: Normal rate, regular rhythm. Grossly normal heart sounds.  Good peripheral circulation. Respiratory: Normal respiratory effort.  Diffuse and scattered expiratory wheezes with slight decrease in air movement throughout. Gastrointestinal: Soft , nondistended, nontender to palpation. No CVA tenderness. Musculoskeletal: No lower extremity tenderness nor edema.  No joint effusions. No signs of acute trauma. Neurologic:  Normal speech and language. No gross focal neurologic deficits are appreciated. No gait instability noted. Skin:  Skin is warm, dry and intact. No rash noted. Psychiatric: Mood and affect are normal. Speech and behavior are normal. ____________________________________________   LABS (all labs ordered are listed, but only abnormal results are displayed)  Labs Reviewed  COMPREHENSIVE METABOLIC PANEL - Abnormal; Notable for the following components:      Result Value   Total Protein 9.2 (*)    Alkaline Phosphatase 34 (*)    All other components within normal limits  SARS CORONAVIRUS 2 (TAT 6-24 HRS)  CBC WITH DIFFERENTIAL/PLATELET  D-DIMER, QUANTITATIVE  POC URINE PREG, ED  TROPONIN I (HIGH SENSITIVITY)  TROPONIN I (HIGH SENSITIVITY)   ____________________________________________  12 Lead EKG  Sinus rhythm, Rate of 113 bpm.  Normal axis and intervals.  No evidence of acute ischemia.  Sinus tachycardia. ____________________________________________  RADIOLOGY  ED MD interpretation: 2 view CXR reviewed by me without evidence of acute cardiopulmonary  pathology.  Official radiology report(s): DG Chest 2 View  Result Date: 05/03/2020 CLINICAL DATA:  Shortness of breath and chest pressure. EXAM: CHEST - 2 VIEW COMPARISON:  08/31/2019 FINDINGS: The lungs are clear without focal pneumonia, edema, pneumothorax or pleural effusion. The cardiopericardial silhouette is within normal limits for size. The visualized bony structures of the thorax show no acute abnormality. Telemetry leads overlie the chest. IMPRESSION: No active cardiopulmonary disease. Electronically Signed   By: Misty Stanley M.D.   On: 05/03/2020 12:47    ____________________________________________   PROCEDURES and INTERVENTIONS  Procedure(s) performed (including Critical Care):  .1-3 Lead EKG Interpretation Performed by: Vladimir Crofts, MD Authorized by: Vladimir Crofts, MD     Interpretation: abnormal     ECG rate:  111   ECG rate assessment: tachycardic     Rhythm: sinus tachycardia     Ectopy: none     Conduction: normal      Medications  ipratropium-albuterol (DUONEB) 0.5-2.5 (3) MG/3ML nebulizer solution 3 mL (3 mLs Nebulization Given 05/03/20 1217)  guaiFENesin (MUCINEX) 12 hr tablet 1,200 mg (1,200 mg Oral Given 05/03/20 1216)    ____________________________________________   MDM / ED COURSE   35 year old female with history of mild intermittent asthma presents to the ED with chest pain, shortness of breath and congestion, most consistent with mild asthma exacerbation amenable to outpatient management.  She presents tachycardic but hematocrit stable not hypoxic on room air.  Exam demonstrates mild stigmata of asthma exacerbation  with diffuse expiratory wheezes and slight decrease in air movement throughout.  Congestion noted.  No sore throat and uvula is midline without evidence of PTA.  CXR demonstrates no infiltrates or PTX.  Patient has resolution of symptoms after DuoNeb and Mucinex.  Otherwise benign work-up.  D-dimer is negative and effectively rules out acute  PE.  Patient refuses prednisone and is discharged with a prescription for albuterol to use as needed.  We discussed OTC medications to assist with her symptoms and return precautions for the ED prior to discharge.  Clinical Course as of 05/03/20 1526  Sat May 03, 2020  1327 Reassessed.  Patient with resolution of symptoms.  She is requesting a Covid swab.  She is refusing prednisone prescription for asthma exacerbation.  We discussed albuterol inhaler prescription and we discussed return precautions for the ED.  She asks about antibiotics for possible sinus infection, and I educate her that this is not indicated [DS]    Clinical Course User Index [DS] Vladimir Crofts, MD    ____________________________________________   FINAL CLINICAL IMPRESSION(S) / ED DIAGNOSES  Final diagnoses:  Mild intermittent asthma with exacerbation  Other chest pain  Shortness of breath     ED Discharge Orders         Ordered    albuterol (VENTOLIN HFA) 108 (90 Base) MCG/ACT inhaler  Every 6 hours PRN        05/03/20 1329           Charda Janis   Note:  This document was prepared using Systems analyst and may include unintentional dictation errors.   Vladimir Crofts, MD 05/03/20 [phone]

## 2020-05-03 NOTE — ED Triage Notes (Signed)
Pt via POV from home. Pt c/o chest pressure, nasal congestion and SOB for the past two days. Denies cough and fever. Pt states that she has a hx of asthma but did not take the inhaler that she had because its was expired. Pt is A&Ox4 and NAD.

## 2020-05-03 NOTE — Discharge Instructions (Addendum)
As we discussed, please pick up the albuterol inhaler from the local pharmacy.  Ensure you have something like Mucinex or Sudafed to help clear the congestion in your nose.  Please take Tylenol and ibuprofen/Advil for your pain.  It is safe to take them together, or to alternate them every few hours.  Take up to 1000mg  of Tylenol at a time, up to 4 times per day.  Do not take more than 4000 mg of Tylenol in 24 hours.  For ibuprofen, take 400-600 mg, 4-5 times per day.  If you develop any fevers or worsening symptoms despite these medications, please return to the ED.

## 2020-05-27 ENCOUNTER — Institutional Professional Consult (permissible substitution): Payer: Managed Care, Other (non HMO) | Admitting: Pulmonary Disease

## 2020-06-24 ENCOUNTER — Other Ambulatory Visit: Payer: Self-pay

## 2020-06-24 ENCOUNTER — Encounter: Payer: Self-pay | Admitting: Obstetrics and Gynecology

## 2020-06-24 ENCOUNTER — Ambulatory Visit: Payer: Managed Care, Other (non HMO) | Admitting: Obstetrics and Gynecology

## 2020-06-24 VITALS — BP 114/79 | HR 102 | Ht 67.0 in | Wt 153.7 lb

## 2020-06-24 DIAGNOSIS — Z113 Encounter for screening for infections with a predominantly sexual mode of transmission: Secondary | ICD-10-CM

## 2020-06-24 DIAGNOSIS — Z308 Encounter for other contraceptive management: Secondary | ICD-10-CM

## 2020-06-24 DIAGNOSIS — D252 Subserosal leiomyoma of uterus: Secondary | ICD-10-CM | POA: Diagnosis not present

## 2020-06-24 DIAGNOSIS — F3281 Premenstrual dysphoric disorder: Secondary | ICD-10-CM

## 2020-06-24 DIAGNOSIS — Z3041 Encounter for surveillance of contraceptive pills: Secondary | ICD-10-CM

## 2020-06-24 NOTE — Progress Notes (Signed)
Pt present for birth control consult. Pt stated having spotting and bleeding between cycles. Pt requested DTD screening, labs and pregnancy test to be done today.  UPT-neg.

## 2020-06-24 NOTE — Progress Notes (Signed)
    GYNECOLOGY PROGRESS NOTE  Subjective:    Patient ID: Cynthia Mcintosh, female    DOB: 26-Apr-1985, 35 y.o.   MRN: [id number]  HPI  Patient is a 35 y.o. G0P0000 female with a h/o fibroid uterus and PMDD who presents for complaints of spotting between cycles and abdominal pressure during her cycles. After last visit she was initiated on Lo-Loestrin after trialing several different herbal remedies. She reports that it helped with her migraines, however still noting significant anxiety and more emotional lability the 2 weeks prior to her cycle.  Also is noting spotting between her cycles.   Additionally, patient desires STI screening and a pregnancy test done. Has no major concerns for either of these but just desires to be sure.   The following portions of the patient's history were reviewed and updated as appropriate: allergies, current medications, past family history, past medical history, past social history, past surgical history and problem list.  Review of Systems Pertinent items noted in HPI and remainder of comprehensive ROS otherwise negative.   Objective:   Blood pressure 114/79, pulse (!) 102, height 5\' 7"  (1.702 m), weight 153 lb 11.2 oz (69.7 kg). General appearance: alert and no distress Abdomen: soft, non-tender; bowel sounds normal; no masses,  no organomegaly Pelvic: external genitalia normal, rectovaginal septum normal.  Internal exam not performed. Blind genital swab performed.  Extremities: extremities normal, atraumatic, no cyanosis or edema Neurologic: Grossly normal    Labs:  Results for orders placed or performed in visit on 06/24/20  POCT urine pregnancy  Result Value Ref Range   Preg Test, Ur Negative Negative     Assessment:   1. Encounter for other contraceptive management   2. PMDD (premenstrual dysphoric disorder)   3. Screen for STD (sexually transmitted disease)   4. Fibroids, subserous   5. Surveillance for birth control, oral contraceptives       Plan:    1. Reviewed other management options for symptoms again (had previously discussed via Mychart). Could opt for higher dosing combined OCP or, trial of progesterone OCP to manage cycles.  Patient will try progesterone OCP. Given sample of Slynd. To complete current pack of Lo-Loestrin and begin Slynd after next cycle.  2. PMDD, currently not well managed with current OCP. Previous discussion had via Mychart about options of use of the progesterone-only dosing, or can consideration of an SSRI to manage symptoms. Patient desires to try progesterone OCP first. May also continue to try herbal supplementation in addition to pills.  3. STD screening and pregnancy test performed at patient's request.    A total of 15 minutes were spent face-to-face with the patient during this encounter and over half of that time dealt with counseling and coordination of care.   Rubie Maid, MD [facility]

## 2020-06-24 NOTE — Patient Instructions (Signed)
Dysfunctional Uterine Bleeding Dysfunctional uterine bleeding is abnormal bleeding from the uterus. Dysfunctional uterine bleeding includes:  A menstrual period that comes earlier or later than usual.  A menstrual period that is lighter or heavier than usual, or has large blood clots.  Vaginal bleeding between menstrual periods.  Skipping one or more menstrual periods.  Vaginal bleeding after sex.  Vaginal bleeding after menopause. Follow these instructions at home: Eating and drinking  Eat well-balanced meals. Include foods that are high in iron, such as liver, meat, shellfish, green leafy vegetables, and eggs.  To prevent or treat constipation, your health care provider may recommend that you: ? Drink enough fluid to keep your urine pale yellow. ? Take over-the-counter or prescription medicines. ? Eat foods that are high in fiber, such as beans, whole grains, and fresh fruits and vegetables. ? Limit foods that are high in fat and processed sugars, such as fried or sweet foods.   Medicines  Take over-the-counter and prescription medicines only as told by your health care provider.  Do not change medicines without talking with your health care provider.  Aspirin or medicines that contain aspirin may make the bleeding worse. Do not take those medicines: ? During the week before your menstrual period. ? During your menstrual period.  If you were prescribed iron pills, take them as told by your health care provider. Iron pills help to replace iron that your body loses because of this condition. Activity  If you need to change your sanitary pad or tampon more than one time every 2 hours: ? Lie in bed with your feet raised (elevated). ? Place a cold pack on your lower abdomen. ? Rest as much as possible until the bleeding stops or slows down.  Do not try to lose weight until the bleeding has stopped and your blood iron level is back to normal. General instructions  For two  months, write down: ? When your menstrual period starts. ? When your menstrual period ends. ? When any abnormal vaginal bleeding occurs. ? What problems you notice.  Keep all follow up visits as told by your health care provider. This is important.   Contact a health care provider if you:  Feel light-headed or weak.  Have nausea and vomiting.  Cannot eat or drink without vomiting.  Feel dizzy or have diarrhea while you are taking medicines.  Are taking birth control pills or hormones, and you want to change them or stop taking them. Get help right away if:  You develop a fever or chills.  You need to change your sanitary pad or tampon more than one time per hour.  Your vaginal bleeding becomes heavier, or your flow contains clots more often.  You develop pain in your abdomen.  You lose consciousness.  You develop a rash. Summary  Dysfunctional uterine bleeding is abnormal bleeding from the uterus.  It includes menstrual bleeding of abnormal duration, volume, or regularity.  Bleeding after sex and after menopause are also considered dysfunctional uterine bleeding. This information is not intended to replace advice given to you by your health care provider. Make sure you discuss any questions you have with your health care provider. Document Revised: 06/22/2017 Document Reviewed: 06/22/2017 Elsevier Patient Education  2021 [employer].

## 2020-06-25 LAB — POCT URINE PREGNANCY: Preg Test, Ur: NEGATIVE

## 2020-06-25 LAB — HEPATITIS C ANTIBODY: Hep C Virus Ab: <0.1 s/co ratio (ref 0.0–0.9)

## 2020-06-25 LAB — HEPATITIS B SURFACE ANTIGEN: Hepatitis B Surface Ag: NEGATIVE

## 2020-06-25 LAB — HIV ANTIBODY (ROUTINE TESTING W REFLEX): HIV Screen 4th Generation wRfx: NONREACTIVE

## 2020-06-25 LAB — RPR: RPR Ser Ql: NONREACTIVE

## 2020-06-26 LAB — NUSWAB VAGINITIS PLUS (VG+)
Candida albicans, NAA: NEGATIVE
Candida glabrata, NAA: NEGATIVE
Chlamydia trachomatis, NAA: NEGATIVE
Neisseria gonorrhoeae, NAA: NEGATIVE
Trich vag by NAA: NEGATIVE

## 2020-08-01 ENCOUNTER — Other Ambulatory Visit: Payer: Self-pay

## 2020-08-01 MED ORDER — SLYND 4 MG PO TABS: 4.0000 mg | ORAL_TABLET | Freq: Every day | ORAL | 11 refills | Status: DC

## 2020-08-05 ENCOUNTER — Encounter: Payer: Self-pay | Admitting: Obstetrics and Gynecology

## 2020-08-05 ENCOUNTER — Ambulatory Visit: Payer: Managed Care, Other (non HMO) | Admitting: Obstetrics and Gynecology

## 2020-08-05 ENCOUNTER — Other Ambulatory Visit: Payer: Self-pay

## 2020-08-05 VITALS — BP 135/89 | HR 121 | Ht 67.0 in | Wt 154.8 lb

## 2020-08-05 DIAGNOSIS — D252 Subserosal leiomyoma of uterus: Secondary | ICD-10-CM | POA: Diagnosis not present

## 2020-08-05 DIAGNOSIS — N923 Ovulation bleeding: Secondary | ICD-10-CM

## 2020-08-05 DIAGNOSIS — F3281 Premenstrual dysphoric disorder: Secondary | ICD-10-CM

## 2020-08-05 DIAGNOSIS — Z113 Encounter for screening for infections with a predominantly sexual mode of transmission: Secondary | ICD-10-CM

## 2020-08-05 MED ORDER — SLYND 4 MG PO TABS: 4.0000 mg | ORAL_TABLET | Freq: Every day | ORAL | 3 refills | Status: DC

## 2020-08-05 NOTE — Progress Notes (Signed)
Pt present for STD screening. Pt stated that she wanted all std screenings.

## 2020-08-05 NOTE — Progress Notes (Signed)
    GYNECOLOGY PROGRESS NOTE  Subjective:    Patient ID: Fayette Pho, female    DOB: [birth year], 35 y.o.   MRN: [id number]  HPI  Patient is a 35 y.o. G0P0000 female who presents for STD screening.  She notes that she felt a small bump on her vagina near the clitoral region several days ago and desires to be screened.  She does not have a prior history of STDs.  Is currently sexually active.  Notes that that the bump was blood-filled, was not tender until she attempted to rupture it.  Area currently is getting smaller, no other symptoms noted at this time.  Of note, patient desires to inform that the [facility] is working well for her menstrual cycles.  She is not experiencing any further intermenstrual bleeding at this time.  Did note an issue getting medication from the pharmacy and cost, however notes that this is been mostly worked out.  The following portions of the patient's history were reviewed and updated as appropriate: allergies, current medications, past family history, past medical history, past social history, past surgical history, and problem list.  Review of Systems Pertinent items noted in HPI and remainder of comprehensive ROS otherwise negative.   Objective:   Blood pressure 135/89, pulse (!) 121, height 5\' 7"  (1.702 m), weight 154 lb 12.8 oz (70.2 kg), last menstrual period 08/03/2020. General appearance: alert and no distress Abdomen: soft, non-tender; bowel sounds normal; no masses,  no organomegaly Pelvic: external genitalia with small minimally raised flesh-colored area on right labia minora ~ 1 cm below clitoral hood, rectovaginal septum normal. No palpable nodule, non-tender.  Vagina without discharge.  Cervix normal appearing, no lesions and no motion tenderness.  Uterus mobile, nontender, normal shape and size.  Adnexae non-palpable, nontender bilaterally.  Extremities: extremities normal, atraumatic, no cyanosis or edema Neurologic: Grossly normal   Assessment:    1. Screening examination for STD (sexually transmitted disease)   2. Fibroids, subserous   3. PMDD (premenstrual dysphoric disorder)   4. Intermenstrual spotting     Plan:   Desires STI screening, including herpes testing.  Also inquires about HPV testing, however noted that patient had recent screening on her last Pap smear which was negative.  Lesion today did not have appearance of an STI.  Advised on over-the-counter topical agents to treat. History of fibroid uterus and PMDD also with intermenstrual spotting on last several birth control pills tried.  Recently utilize trial of Slynd, notes that this is working well for her, has not had any further spotting.  We will also send prescription to mail order pharmacy to see if this may be a cheaper option for patient to obtain medication.  Follow-up as needed.    Rubie Maid, MD [facility]

## 2020-08-08 LAB — RPR: RPR Ser Ql: NONREACTIVE

## 2020-08-08 LAB — HSV(HERPES SMPLX)ABS-I+II(IGG+IGM)-BLD
HSV 1 Glycoprotein G Ab, IgG: <0.91 index (ref 0.00–0.90)
HSV 2 IgG, Type Spec: <0.91 index (ref 0.00–0.90)
HSVI/II Comb IgM: <0.91 Ratio (ref 0.00–0.90)

## 2020-08-08 LAB — HIV ANTIBODY (ROUTINE TESTING W REFLEX): HIV Screen 4th Generation wRfx: NONREACTIVE

## 2020-08-08 LAB — HEPATITIS C ANTIBODY: Hep C Virus Ab: <0.1 s/co ratio (ref 0.0–0.9)

## 2020-08-08 LAB — HEPATITIS B SURFACE ANTIGEN: Hepatitis B Surface Ag: NEGATIVE

## 2020-08-10 LAB — NUSWAB VAGINITIS PLUS (VG+)
Candida albicans, NAA: NEGATIVE
Candida glabrata, NAA: NEGATIVE
Chlamydia trachomatis, NAA: NEGATIVE
Neisseria gonorrhoeae, NAA: NEGATIVE
Trich vag by NAA: NEGATIVE

## 2020-08-18 NOTE — Telephone Encounter (Signed)
I called patient. She thinks that she had an allergic reaction to the tea tree oil. I suggested that she try the witch hazel and if it did not completely resolved to come in for an appointment. She verbalized understanding and agreed to try the witch hazel.

## 2020-10-22 NOTE — Progress Notes (Signed)
.Cardiology Office Note  Date:  10/24/2020   ID:  Cynthia Mcintosh, DOB [birth year], MRN [id number]  PCP:  Carloyn Manner, MD   Chief Complaint  Patient presents with   12 month follow up     Patient c/o shortness of breath, chest discomfort/chest pressure in mid-sternum and rapid heartbeats at times. Medications reviewed by the patient verbally.      HPI:  Ms. Cynthia Mcintosh is a 35 year old woman with past medical history of Anxiety/depression/panic attacks dating back to 2012 Presents for discussion of her tachycardia  Covid Feb 18 2020 She reports that she has a Dx of asthma  Works 2 1/2 days a week Heavy lifting at work  Reports that she is off all of her anxiety medications She is a started having increasing frequency of tachycardia, some chest tightness  She reports that she does have Ativan, Xanax, will probably need refills She does have a psychiatrist  We discussed prior event monitor showing sinus tachycardia no arrhythmia, triggered events associated with normal rhythm  EKG personally reviewed by myself on todays visit Sinus tachycardia rate 108 bpm no significant ST or T wave changes  Prior records reviewed Previously tried Lexapro, BuSpar, Celexa, Xanax, amitriptyline Prior panic attacks, has called EMS for presyncope and tachycardia symptoms Panic attack history Fear of driving   Previously seen in the emergency room January 2021 for chest pain/chest tightness Seen in [facility] At that time reported fatigue, malaise, nausea, sinus pressure, chest congestion with discomfort, loss of appetite, shortness of breath, abdominal discomfort with cramping, mild diarrhea Work-up in the ER unrevealing for cardiac etiology She was given albuterol inhaler  She does have some lightheadedness and dizziness if she sits up Orthostatics were done, no drop in blood pressure, 665 up to 993 systolic with sitting and standing  Currently staying with family locally, does  not feel that she is able to go to work secondary to anxiety symptoms Denies any recent triggers  EKG personally reviewed by myself on todays visit Shows sinus tachycardia rate 101 bpm no significant ST or T wave changes   PMH:   has a past medical history of Anxiety and COVID.  PSH:    Past Surgical History:  Procedure Laterality Date   TONSILECTOMY/ADENOIDECTOMY WITH MYRINGOTOMY      Current Outpatient Medications  Medication Sig Dispense Refill   albuterol (VENTOLIN HFA) 108 (90 Base) MCG/ACT inhaler Inhale 2 puffs into the lungs every 6 (six) hours as needed for wheezing or shortness of breath. 8 g 2   Azelastine HCl 0.15 % SOLN Place into both nostrils.     Drospirenone (SLYND) 4 MG TABS Take 4 mg by mouth daily. 84 tablet 3   fluticasone furoate-vilanterol (BREO ELLIPTA) 100-25 MCG/INH AEPB Inhale into the lungs.     levocetirizine (XYZAL) 5 MG tablet Take 5 mg by mouth every evening.     LORazepam (ATIVAN) 1 MG tablet Take by mouth.     Multiple Vitamin (MULTI-VITAMIN) tablet Take 1 tablet by mouth daily.     triamcinolone (NASACORT) 55 MCG/ACT AERO nasal inhaler 2 sprays daily.     No current facility-administered medications for this visit.     Allergies:   Patient has no known allergies.   Social History:  The patient  reports that she has never smoked. She has never used smokeless tobacco. She reports that she does not currently use alcohol. She reports that she does not currently use drugs.   Family History:   family  history includes COPD in her maternal grandmother; Diabetes in her maternal grandfather; Hypercholesterolemia in her mother; Hypertension in her father, maternal grandfather, and mother; Stroke in her maternal grandfather; Thyroid disease in her father.    Review of Systems: Review of Systems  Constitutional: Negative.   Respiratory: Negative.    Cardiovascular: Negative.   Gastrointestinal: Negative.   Musculoskeletal: Negative.   Neurological:  Negative.   Psychiatric/Behavioral:  The patient is nervous/anxious and has insomnia.   All other systems reviewed and are negative.  PHYSICAL EXAM: VS:  BP 130/80 (BP Location: Left Arm, Patient Position: Sitting, Cuff Size: Normal)   Pulse (!) 108   Ht 5\' 8"  (1.727 m)   Wt 158 lb (71.7 kg)   SpO2 98%   BMI 24.02 kg/m  , BMI Body mass index is 24.02 kg/m. GEN: Well nourished, well developed, in no acute distress HEENT: normal Neck: no JVD, carotid bruits, or masses Cardiac: Tachycardic, regular no murmurs, rubs, or gallops,no edema  Respiratory:  clear to auscultation bilaterally, normal work of breathing GI: soft, nontender, nondistended, + BS MS: no deformity or atrophy Skin: warm and dry, no rash Neuro:  Strength and sensation are intact Psych: euthymic mood, full affect   Recent Labs: 03/06/2020: TSH 1.870 05/03/2020: ALT 19; BUN 13; Creatinine, Ser 0.84; Hemoglobin 14.3; Platelets 283; Potassium 3.6; Sodium 138    Lipid Panel No results found for: CHOL, HDL, LDLCALC, TRIG    Wt Readings from Last 3 Encounters:  10/24/20 158 lb (71.7 kg)  08/05/20 154 lb 12.8 oz (70.2 kg)  06/24/20 153 lb 11.2 oz (69.7 kg)      ASSESSMENT AND PLAN:  Problem List Items Addressed This Visit   None Visit Diagnoses     Paroxysmal tachycardia (HCC)    -  Primary   Relevant Orders   EKG 12-Lead   LONG TERM MONITOR (3-14 DAYS)   Palpitations       Relevant Orders   EKG 12-Lead   LONG TERM MONITOR (3-14 DAYS)   Chest tightness       Relevant Orders   EKG 12-Lead   ECHOCARDIOGRAM COMPLETE      Anxiety Managed by psychiatry Reports that she is not taking her medications at this time Anxiety also initially on her prior clinic visit Reports that she has tried numerous medications Tachycardia likely driven by underlying poorly controlled anxiety Appears very anxious on todays visit  Paroxysmal tachycardia She is requesting repeat monitor Will likely show sinus tachycardia  we will driven by anxiety, this was discussed with her Despite this she feels symptoms are worse now off her anxiety medications and would like confirmation there is no underlying cardiac issue    Total encounter time more than 25 minutes  Greater than 50% was spent in counseling and coordination of care with the patient    Signed, Esmond Plants, M.D., Ph.D. [place], [place]

## 2020-10-24 ENCOUNTER — Encounter: Payer: Self-pay | Admitting: Cardiovascular Disease

## 2020-10-24 ENCOUNTER — Other Ambulatory Visit: Payer: Self-pay

## 2020-10-24 ENCOUNTER — Ambulatory Visit: Payer: Managed Care, Other (non HMO) | Admitting: Cardiovascular Disease

## 2020-10-24 ENCOUNTER — Ambulatory Visit (INDEPENDENT_AMBULATORY_CARE_PROVIDER_SITE_OTHER): Payer: Managed Care, Other (non HMO)

## 2020-10-24 VITALS — BP 130/80 | HR 108 | Ht 68.0 in | Wt 158.0 lb

## 2020-10-24 DIAGNOSIS — I479 Paroxysmal tachycardia, unspecified: Secondary | ICD-10-CM

## 2020-10-24 DIAGNOSIS — R002 Palpitations: Secondary | ICD-10-CM | POA: Diagnosis not present

## 2020-10-24 DIAGNOSIS — F419 Anxiety disorder, unspecified: Secondary | ICD-10-CM

## 2020-10-24 DIAGNOSIS — F321 Major depressive disorder, single episode, moderate: Secondary | ICD-10-CM

## 2020-10-24 DIAGNOSIS — F32A Depression, unspecified: Secondary | ICD-10-CM

## 2020-10-24 DIAGNOSIS — R0789 Other chest pain: Secondary | ICD-10-CM

## 2020-10-24 NOTE — Patient Instructions (Addendum)
Echo for tachycardia/chest tightness  Medication Instructions:  No changes  If you need a refill on your cardiac medications before your next appointment, please call your pharmacy.    Lab work: No new labs needed   If you have labs (blood work) drawn today and your tests are completely normal, you will receive your results only by: [place] (if you have MyChart) OR A paper copy in the mail If you have any lab test that is abnormal or we need to change your treatment, we will call you to review the results.   Testing/Procedures:  1) Echocardiogram:  - Your physician has requested that you have an echocardiogram (after 11/07/20) . Echocardiography is a painless test that uses sound waves to create images of your heart. It provides your doctor with information about the size and shape of your heart and how well your heart's chambers and valves are working. This procedure takes approximately one hour. There are no restrictions for this procedure. There is a possibility that an IV may need to be started during your test to inject an image enhancing agent. This is done to obtain more optimal pictures of your heart. Therefore we ask that you do at least drink some water prior to coming in to hydrate your veins.    2) Heart monitor x 14 days: (to be placed in office today)  - Your physician has recommended that you wear a Zio XT (heart) monitor.   This monitor is a medical device that records the heart's electrical activity. Doctors most often use these monitors to diagnose arrhythmias. Arrhythmias are problems with the speed or rhythm of the heartbeat. The monitor is a small device applied to your chest. You can wear one while you do your normal daily activities. While wearing this monitor if you have any symptoms to push the button and record what you felt. Once you have worn this monitor for the period of time provider prescribed (Usually 14 days), you will return the monitor device  in the postage paid box. Once it is returned they will download the data collected and provide [place] with a report which the provider will then review and we will call you with those results. Important tips:  Avoid showering during the first 24 hours of wearing the monitor. Avoid excessive sweating to help maximize wear time. Do not submerge the device, no hot tubs, and no swimming pools. Keep any lotions or oils away from the patch. After 24 hours you may shower with the patch on. Take brief showers with your back facing the shower head.  Do not remove patch once it has been placed because that will interrupt data and decrease adhesive wear time. Push the button when you have any symptoms and write down what you were feeling. Once you have completed wearing your monitor, remove and place into box which has postage paid and place in your outgoing mailbox.  If for some reason you have misplaced your box then call our office and we can provide another box and/or mail it off for you.      Follow-Up: At [facility], you and your health needs are our priority.  As part of our continuing mission to provide you with exceptional heart care, we have created designated Provider Care Teams.  These Care Teams include your primary Cardiologist (physician) and Advanced Practice Providers (APPs -  Physician Assistants and Nurse Practitioners) who all work together to provide you with the care you need, when you need  it.  You will need a follow up appointment as needed  Providers on your designated Care Team:   Murray Hodgkins, NP Christell Faith, PA-C Marrianne Mood, PA-C Cadence Kathlen Mody, [place]  Any Other Special Instructions Will Be Listed Below (If Applicable).  COVID-19 Vaccine Information can be found at: [email or web address] For questions related to vaccine distribution or appointments, please email [email or web address] or call [phone].

## 2020-10-30 ENCOUNTER — Encounter: Payer: Managed Care, Other (non HMO) | Admitting: Certified Nurse Midwife

## 2020-10-30 ENCOUNTER — Ambulatory Visit (INDEPENDENT_AMBULATORY_CARE_PROVIDER_SITE_OTHER): Payer: Managed Care, Other (non HMO) | Admitting: Obstetrics and Gynecology

## 2020-10-30 ENCOUNTER — Encounter: Payer: Self-pay | Admitting: Obstetrics and Gynecology

## 2020-10-30 ENCOUNTER — Other Ambulatory Visit: Payer: Self-pay

## 2020-10-30 VITALS — BP 124/86 | HR 103 | Ht 68.0 in | Wt 157.3 lb

## 2020-10-30 DIAGNOSIS — Z01419 Encounter for gynecological examination (general) (routine) without abnormal findings: Secondary | ICD-10-CM

## 2020-10-30 DIAGNOSIS — Z113 Encounter for screening for infections with a predominantly sexual mode of transmission: Secondary | ICD-10-CM

## 2020-10-30 DIAGNOSIS — D252 Subserosal leiomyoma of uterus: Secondary | ICD-10-CM | POA: Diagnosis not present

## 2020-10-30 DIAGNOSIS — Z2821 Immunization not carried out because of patient refusal: Secondary | ICD-10-CM

## 2020-10-30 DIAGNOSIS — Z13220 Encounter for screening for lipoid disorders: Secondary | ICD-10-CM

## 2020-10-30 DIAGNOSIS — Z3202 Encounter for pregnancy test, result negative: Secondary | ICD-10-CM

## 2020-10-30 DIAGNOSIS — N921 Excessive and frequent menstruation with irregular cycle: Secondary | ICD-10-CM

## 2020-10-30 DIAGNOSIS — F3281 Premenstrual dysphoric disorder: Secondary | ICD-10-CM | POA: Diagnosis not present

## 2020-10-30 DIAGNOSIS — F419 Anxiety disorder, unspecified: Secondary | ICD-10-CM

## 2020-10-30 DIAGNOSIS — Z131 Encounter for screening for diabetes mellitus: Secondary | ICD-10-CM

## 2020-10-30 MED ORDER — ESTRADIOL 1 MG PO TABS: 1.0000 mg | ORAL_TABLET | Freq: Every day | ORAL | 1 refills | Status: DC

## 2020-10-30 NOTE — Patient Instructions (Signed)
Preventive Care 21-35 Years Old, Female Preventive care refers to lifestyle choices and visits with your health care provider that can promote health and wellness. This includes: A yearly physical exam. This is also called an annual wellness visit. Regular dental and eye exams. Immunizations. Screening for certain conditions. Healthy lifestyle choices, such as: Eating a healthy diet. Getting regular exercise. Not using drugs or products that contain nicotine and tobacco. Limiting alcohol use. What can I expect for my preventive care visit? Physical exam Your health care provider may check your: Height and weight. These may be used to calculate your BMI (body mass index). BMI is a measurement that tells if you are at a healthy weight. Heart rate and blood pressure. Body temperature. Skin for abnormal spots. Counseling Your health care provider may ask you questions about your: Past medical problems. Family's medical history. Alcohol, tobacco, and drug use. Emotional well-being. Home life and relationship well-being. Sexual activity. Diet, exercise, and sleep habits. Work and work environment. Access to firearms. Method of birth control. Menstrual cycle. Pregnancy history. What immunizations do I need? Vaccines are usually given at various ages, according to a schedule. Your health care provider will recommend vaccines for you based on your age, medical history, and lifestyle or other factors, such as travel or where you work. What tests do I need? Blood tests Lipid and cholesterol levels. These may be checked every 5 years starting at age 20. Hepatitis C test. Hepatitis B test. Screening Diabetes screening. This is done by checking your blood sugar (glucose) after you have not eaten for a while (fasting). STD (sexually transmitted disease) testing, if you are at risk. BRCA-related cancer screening. This may be done if you have a family history of breast, ovarian, tubal, or  peritoneal cancers. Pelvic exam and Pap test. This may be done every 3 years starting at age 21. Starting at age 30, this may be done every 5 years if you have a Pap test in combination with an HPV test. Talk with your health care provider about your test results, treatment options, and if necessary, the need for more tests. Follow these instructions at home: Eating and drinking  Eat a healthy diet that includes fresh fruits and vegetables, whole grains, lean protein, and low-fat dairy products. Take vitamin and mineral supplements as recommended by your health care provider. Do not drink alcohol if: Your health care provider tells you not to drink. You are pregnant, may be pregnant, or are planning to become pregnant. If you drink alcohol: Limit how much you have to 0-1 drink a day. Be aware of how much alcohol is in your drink. In the [place] one drink equals one 12 oz bottle of beer (355 mL), one 5 oz glass of wine (148 mL), or one 1 oz glass of hard liquor (44 mL). Lifestyle Take daily care of your teeth and gums. Brush your teeth every morning and night with fluoride toothpaste. Floss one time each day. Stay active. Exercise for at least 30 minutes 5 or more days each week. Do not use any products that contain nicotine or tobacco, such as cigarettes, e-cigarettes, and chewing tobacco. If you need help quitting, ask your health care provider. Do not use drugs. If you are sexually active, practice safe sex. Use a condom or other form of protection to prevent STIs (sexually transmitted infections). If you do not wish to become pregnant, use a form of birth control. If you plan to become pregnant, see your health care provider   for a prepregnancy visit. Find healthy ways to cope with stress, such as: Meditation, yoga, or listening to music. Journaling. Talking to a trusted person. Spending time with friends and family. Safety Always wear your seat belt while driving or riding in a  vehicle. Do not drive: If you have been drinking alcohol. Do not ride with someone who has been drinking. When you are tired or distracted. While texting. Wear a helmet and other protective equipment during sports activities. If you have firearms in your house, make sure you follow all gun safety procedures. Seek help if you have been physically or sexually abused. What's next? Go to your health care provider once a year for an annual wellness visit. Ask your health care provider how often you should have your eyes and teeth checked. Stay up to date on all vaccines. This information is not intended to replace advice given to you by your health care provider. Make sure you discuss any questions you have with your health care provider. Document Revised: 03/21/2020 Document Reviewed: 09/22/2017 Elsevier Patient Education  2022 [place] Breast self-awareness means being familiar with how your breasts look and feel. It involves checking your breasts regularly and reporting any changes to your health care provider. Practicing breast self-awareness is important. Sometimes changes may not be harmful (are benign), but sometimes a change in your breasts can be a sign of a serious medical problem. It is important to learn how to do this procedure correctly so that you can catch problems early, when treatment is more likely to be successful. All women should practice breast self-awareness, including women who have had breast implants. What you need: A mirror. A well-lit room. How to do a breast self-exam A breast self-exam is one way to learn what is normal for your breasts and whether your breasts are changing. To do a breast self-exam: Look for changes  Remove all the clothing above your waist. Stand in front of a mirror in a room with good lighting. Put your hands on your hips. Push your hands firmly downward. Compare your breasts in the mirror. Look for  differences between them (asymmetry), such as: Differences in shape. Differences in size. Puckers, dips, and bumps in one breast and not the other. Look at each breast for changes in the skin, such as: Redness. Scaly areas. Look for changes in your nipples, such as: Discharge. Bleeding. Dimpling. Redness. A change in position. Feel for changes Carefully feel your breasts for lumps and changes. It is best to do this while lying on your back on the floor, and again while sitting or standing in the tub or shower with soapy water on your skin. Feel each breast in the following way: Place the arm on the side of the breast you are examining above your head. Feel your breast with the other hand. Start in the nipple area and make -inch (2 cm) overlapping circles to feel your breast. Use the pads of your three middle fingers to do this. Apply light pressure, then medium pressure, then firm pressure. The light pressure will allow you to feel the tissue closest to the skin. The medium pressure will allow you to feel the tissue that is a little deeper. The firm pressure will allow you to feel the tissue close to the ribs. Continue the overlapping circles, moving downward over the breast until you feel your ribs below your breast. Move one finger-width toward the center of the body. Continue to use the -  inch (2 cm) overlapping circles to feel your breast as you move slowly up toward your collarbone. Continue the up-and-down exam using all three pressures until you reach your armpit.  Write down what you find Writing down what you find can help you remember what to discuss with your health care provider. Write down: What is normal for each breast. Any changes that you find in each breast, including: The kind of changes you find. Any pain or tenderness. Size and location of any lumps. Where you are in your menstrual cycle, if you are still menstruating. General tips and recommendations Examine your  breasts every month. If you are breastfeeding, the best time to examine your breasts is after a feeding or after using a breast pump. If you menstruate, the best time to examine your breasts is 5-7 days after your period. Breasts are generally lumpier during menstrual periods, and it may be more difficult to notice changes. With time and practice, you will become more familiar with the variations in your breasts and more comfortable with the exam. Contact a health care provider if you: See a change in the shape or size of your breasts or nipples. See a change in the skin of your breast or nipples, such as a reddened or scaly area. Have unusual discharge from your nipples. Find a lump or thick area that was not there before. Have pain in your breasts. Have any concerns related to your breast health. Summary Breast self-awareness includes looking for physical changes in your breasts, as well as feeling for any changes within your breasts. Breast self-awareness should be performed in front of a mirror in a well-lit room. You should examine your breasts every month. If you menstruate, the best time to examine your breasts is 5-7 days after your menstrual period. Let your health care provider know of any changes you notice in your breasts, including changes in size, changes on the skin, pain or tenderness, or unusual fluid from your nipples. This information is not intended to replace advice given to you by your health care provider. Make sure you discuss any questions you have with your health care provider. Document Revised: 08/30/2017 Document Reviewed: 08/30/2017 Elsevier Patient Education  [place].

## 2020-10-30 NOTE — Progress Notes (Addendum)
GYNECOLOGY ANNUAL PHYSICAL EXAM PROGRESS NOTE  Subjective:    Cynthia Mcintosh is a 35 y.o. [place] female who presents for an annual exam. The patient is sexually active. The patient participates in regular exercise: yes. Has the patient ever been transfused or tattooed?: yes. The patient reports that there is not domestic violence in her life.   The patient has the following complaints today:  Desires STI screening. Denies possibility of recent exposure, however just desires to be screened routinely.  Has been sexually active with same partner for over 1 year. Patient is still noting breakthrough bleeding with the use of her birth control.  Currently on Slynd, has been on this regimen for approximately 5 months.  She does have a history of a subserosal fibroid.  She notes that it is helping to control her other symptoms like her PMDD. Lastly patient reports that she has noticed an increase in her anxiety lately.  She does report some stressors at work recently.  Has been attempting to make contact with her psychiatrist (in [place]), however is having difficulty.  Usually takes Ativan or Xanax for her symptoms.  Wonders if a prescription can be given today.  Menstrual History: Menarche age: 48 Patient's last menstrual period was 10/16/2020 (exact date). Period Duration (Days): 8 Period Pattern: (!) Irregular Menstrual Flow: Moderate, Light Menstrual Control: Panty liner, Maxi pad Menstrual Control Change Freq (Hours): 6 Dysmenorrhea: (!) Moderate Dysmenorrhea Symptoms: Cramping, Headache, Nausea   Gynecologic History:  Contraception: oral progesterone-only contraceptive History of STI's: Yes Last Pap: 10/29/2019. Results were: normal.  Denies h/o abnormal pap smears.     Upstream - 10/30/20 1405       Pregnancy Intention Screening   Does the patient want to become pregnant in the next year? No    Does the patient's partner want to become pregnant in the next year? No    Would  the patient like to discuss contraceptive options today? No      Contraception Wrap Up   Current Method Oral Contraceptive    End Method Oral Contraceptive    Contraception Counseling Provided No            The pregnancy intention screening data noted above was reviewed. Potential methods of contraception were discussed. The patient elected to proceed with Oral Contraceptive.     OB History  Gravida Para Term Preterm AB Living  0 0 0 0 0 0  SAB IAB Ectopic Multiple Live Births  0 0 0 0 0    Past Medical History:  Diagnosis Date   Anxiety    COVID     Past Surgical History:  Procedure Laterality Date   TONSILECTOMY/ADENOIDECTOMY WITH MYRINGOTOMY      Family History  Problem Relation Age of Onset   Hypertension Mother    Hypercholesterolemia Mother    Hypertension Father    Thyroid disease Father    COPD Maternal Grandmother    Diabetes Maternal Grandfather    Hypertension Maternal Grandfather    Stroke Maternal Grandfather     Social History   Socioeconomic History   Marital status: Significant Other    Spouse name: Not on file   Number of children: Not on file   Years of education: Not on file   Highest education level: Not on file  Occupational History   Not on file  Tobacco Use   Smoking status: Never   Smokeless tobacco: Never  Vaping Use   Vaping Use: Never used  Substance  and Sexual Activity   Alcohol use: Not Currently   Drug use: Not Currently   Sexual activity: Yes    Birth control/protection: Pill  Other Topics Concern   Not on file  Social History Narrative   Not on file   Social Determinants of Health   Financial Resource Strain: Not on file  Food Insecurity: Not on file  Transportation Needs: Not on file  Physical Activity: Not on file  Stress: Not on file  Social Connections: Not on file  Intimate Partner Violence: Not on file    Current Outpatient Medications on File Prior to Visit  Medication Sig Dispense Refill    albuterol (VENTOLIN HFA) 108 (90 Base) MCG/ACT inhaler Inhale 2 puffs into the lungs every 6 (six) hours as needed for wheezing or shortness of breath. 8 g 2   Azelastine HCl 0.15 % SOLN Place into both nostrils.     Drospirenone (SLYND) 4 MG TABS Take 4 mg by mouth daily. 84 tablet 3   fluticasone furoate-vilanterol (BREO ELLIPTA) 100-25 MCG/INH AEPB Inhale into the lungs.     levocetirizine (XYZAL) 5 MG tablet Take 5 mg by mouth every evening.     LORazepam (ATIVAN) 1 MG tablet Take by mouth.     Multiple Vitamin (MULTI-VITAMIN) tablet Take 1 tablet by mouth daily.     triamcinolone (NASACORT) 55 MCG/ACT AERO nasal inhaler 2 sprays daily.     No current facility-administered medications on file prior to visit.    No Known Allergies   Review of Systems Constitutional: negative for chills, fatigue, fevers and sweats Eyes: negative for irritation, redness and visual disturbance Ears, nose, mouth, throat, and face: negative for hearing loss, nasal congestion, snoring and tinnitus Respiratory: negative for asthma, cough, sputum Cardiovascular: negative for chest pain, dyspnea, exertional chest pressure/discomfort, irregular heart beat, palpitations and syncope Gastrointestinal: negative for abdominal pain, change in bowel habits, nausea and vomiting Genitourinary: negative for abnormal menstrual periods, genital lesions, sexual problems and vaginal discharge, dysuria and urinary incontinence Integument/breast: negative for breast lump, breast tenderness and nipple discharge Hematologic/lymphatic: negative for bleeding and easy bruising Musculoskeletal:negative for back pain and muscle weakness Neurological: negative for dizziness, headaches, vertigo and weakness Endocrine: negative for diabetic symptoms including polydipsia, polyuria and skin dryness Allergic/Immunologic: negative for hay fever and urticaria      Objective:  Blood pressure 124/86, pulse (!) 103, height 5\' 8"  (1.727 m),  weight 157 lb 4.8 oz (71.4 kg), last menstrual period 10/16/2020. Body mass index is 23.92 kg/m.  General Appearance:    Alert, cooperative, no distress, appears stated age  Head:    Normocephalic, without obvious abnormality, atraumatic  Eyes:    PERRL, conjunctiva/corneas clear, EOM's intact, both eyes  Ears:    Normal external ear canals, both ears  Nose:   Nares normal, septum midline, mucosa normal, no drainage or sinus tenderness  Throat:   Lips, mucosa, and tongue normal; teeth and gums normal  Neck:   Supple, symmetrical, trachea midline, no adenopathy; thyroid: no enlargement/tenderness/nodules; no carotid bruit or JVD  Back:     Symmetric, no curvature, ROM normal, no CVA tenderness  Lungs:     Clear to auscultation bilaterally, respirations unlabored  Chest Wall:    No tenderness or deformity   Heart:    Regular rate and rhythm, S1 and S2 normal, no murmur, rub or gallop  Breast Exam:    No tenderness, masses, or nipple abnormality  Abdomen:     Soft, non-tender, bowel sounds  active all four quadrants, no masses, no organomegaly.    Genitalia:    Pelvic:external genitalia normal, vagina without lesions discharge, or tenderness, small amount of dark brown blood noted in vaginal vault.  Rectovaginal septum normal. Cervix normal in appearance, no cervical motion tenderness, no adnexal masses or tenderness.  Uterus normal size, shape, mobile, regular contours, nontender.  Rectal:    Normal external sphincter.  No hemorrhoids appreciated. Internal exam not done.   Extremities:   Extremities normal, atraumatic, no cyanosis or edema  Pulses:   2+ and symmetric all extremities  Skin:   Skin color, texture, turgor normal, no rashes or lesions  Lymph nodes:   Cervical, supraclavicular, and axillary nodes normal  Neurologic:   CNII-XII intact, normal strength, sensation and reflexes throughout   .  Labs:  Lab Results  Component Value Date   WBC 9.1 05/03/2020   HGB 14.3 05/03/2020    HCT 43.1 05/03/2020   MCV 93.9 05/03/2020   PLT 283 05/03/2020    Lab Results  Component Value Date   CREATININE 0.84 05/03/2020   BUN 13 05/03/2020   NA 138 05/03/2020   K 3.6 05/03/2020   CL 106 05/03/2020   CO2 23 05/03/2020    Lab Results  Component Value Date   ALT 19 05/03/2020   AST 25 05/03/2020   ALKPHOS 34 (L) 05/03/2020   BILITOT 0.6 05/03/2020    Lab Results  Component Value Date   TSH 1.870 03/06/2020     Assessment:   1. Encounter for well woman exam with routine gynecological exam   2. Screening for STD (sexually transmitted disease)   3. Pregnancy examination or test, negative result   4. Influenza vaccination declined   5. Screening for lipid disorders   6. Screening for diabetes mellitus   7. PMDD (premenstrual dysphoric disorder)   8. Fibroids, subserous   9. Breakthrough bleeding on birth control pills   10. Anxiety      Plan:  - Blood tests: CBC with diff, Comprehensive metabolic panel (previously tested earlier this year but desires repeat labs). STD screening, pregnancy test desired.  - Breast self exam technique reviewed and patient encouraged to perform self-exam monthly. - Contraception: oral progesterone-only contraceptive.  Discussed option of alternative method due to breakthrough bleeding.  Patient notes that she is afraid to change the medication as it is helping to manage her other symptoms despite the breakthrough bleeding.  Still desires to give the medication more time.  We will continue Slynd.  Also can prescribe supplemental estradiol to help with possible unopposed progesterone therapy affects on the uterus. - Discussed healthy lifestyle modifications. - Pap smear  up to date . - COVID vaccination status: declines - Flu vaccine declined.  - Subserosal fibroid (~ 4 cm). Not a likely cause of her persistent abnormal bleeding.  However patient notes that she may consider surgical intervention in the future with hysterectomy as she  does not desire childbearing and continues to experience bleeding. -PMDD currently managed with oral contraceptive. -Anxiety.  Patient notes that she has a psychiatrist but is not able to reach them for a refill, has called and left several messages.  Advised to attempt to contact online, and by phone once more.  If still no response, can give short supply of medication until she is able to reach them. - Follow up in 1 year for annual exam   Rubie Maid, MD [facility] ]

## 2020-10-31 LAB — HSV(HERPES SIMPLEX VRS) I + II AB-IGG
HSV 1 Glycoprotein G Ab, IgG: <0.91 index (ref 0.00–0.90)
HSV 2 IgG, Type Spec: <0.91 index (ref 0.00–0.90)

## 2020-10-31 LAB — COMPREHENSIVE METABOLIC PANEL
ALT: 11 IU/L (ref 0–32)
AST: 17 IU/L (ref 0–40)
Albumin/Globulin Ratio: 1.4 (ref 1.2–2.2)
Albumin: 4.4 g/dL (ref 3.8–4.8)
Alkaline Phosphatase: 54 IU/L (ref 44–121)
BUN/Creatinine Ratio: 11 (ref 9–23)
BUN: 8 mg/dL (ref 6–20)
Bilirubin Total: <0.2 mg/dL (ref 0.0–1.2)
CO2: 22 mmol/L (ref 20–29)
Calcium: 9.4 mg/dL (ref 8.7–10.2)
Chloride: 104 mmol/L (ref 96–106)
Creatinine, Ser: 0.75 mg/dL (ref 0.57–1.00)
Globulin, Total: 3.1 g/dL (ref 1.5–4.5)
Glucose: 83 mg/dL (ref 70–99)
Potassium: 3.9 mmol/L (ref 3.5–5.2)
Sodium: 140 mmol/L (ref 134–144)
Total Protein: 7.5 g/dL (ref 6.0–8.5)
eGFR: 106 mL/min/{1.73_m2} (ref >59)

## 2020-10-31 LAB — HEPATITIS C ANTIBODY: Hep C Virus Ab: <0.1 s/co ratio (ref 0.0–0.9)

## 2020-10-31 LAB — RPR: RPR Ser Ql: NONREACTIVE

## 2020-10-31 LAB — CBC WITH DIFFERENTIAL/PLATELET
Basophils Absolute: 0 10*3/uL (ref 0.0–0.2)
Basos: 1 %
EOS (ABSOLUTE): 0.1 10*3/uL (ref 0.0–0.4)
Eos: 1 %
Hematocrit: 38.7 % (ref 34.0–46.6)
Hemoglobin: 13.2 g/dL (ref 11.1–15.9)
Immature Grans (Abs): 0 10*3/uL (ref 0.0–0.1)
Immature Granulocytes: 0 %
Lymphocytes Absolute: 2.1 10*3/uL (ref 0.7–3.1)
Lymphs: 28 %
MCH: 31.7 pg (ref 26.6–33.0)
MCHC: 34.1 g/dL (ref 31.5–35.7)
MCV: 93 fL (ref 79–97)
Monocytes Absolute: 0.4 10*3/uL (ref 0.1–0.9)
Monocytes: 6 %
Neutrophils Absolute: 4.9 10*3/uL (ref 1.4–7.0)
Neutrophils: 64 %
Platelets: 243 10*3/uL (ref 150–450)
RBC: 4.16 x10E6/uL (ref 3.77–5.28)
RDW: 12.3 % (ref 11.7–15.4)
WBC: 7.6 10*3/uL (ref 3.4–10.8)

## 2020-10-31 LAB — HEMOGLOBIN A1C
Est. average glucose Bld gHb Est-mCnc: 114 mg/dL
Hgb A1c MFr Bld: 5.6 % (ref 4.8–5.6)

## 2020-10-31 LAB — HIV ANTIBODY (ROUTINE TESTING W REFLEX): HIV Screen 4th Generation wRfx: NONREACTIVE

## 2020-10-31 LAB — LIPID PANEL
Chol/HDL Ratio: 2.6 ratio (ref 0.0–4.4)
Cholesterol, Total: 166 mg/dL (ref 100–199)
HDL: 65 mg/dL (ref >39)
LDL Chol Calc (NIH): 83 mg/dL (ref 0–99)
Triglycerides: 100 mg/dL (ref 0–149)
VLDL Cholesterol Cal: 18 mg/dL (ref 5–40)

## 2020-11-03 LAB — NUSWAB VAGINITIS PLUS (VG+)
Candida albicans, NAA: NEGATIVE
Candida glabrata, NAA: NEGATIVE
Chlamydia trachomatis, NAA: NEGATIVE
Neisseria gonorrhoeae, NAA: NEGATIVE
Trich vag by NAA: NEGATIVE

## 2020-11-12 ENCOUNTER — Telehealth: Payer: Self-pay | Admitting: Cardiovascular Disease

## 2020-11-12 NOTE — Telephone Encounter (Signed)
Patient came into office with zio monitor stating that she lost her box. She provided her name, date of birth, and advised that I would send this monitor in for her. She was appreciative for the help with no further questions at this time.

## 2020-11-28 ENCOUNTER — Other Ambulatory Visit: Payer: Self-pay

## 2020-11-28 ENCOUNTER — Ambulatory Visit (INDEPENDENT_AMBULATORY_CARE_PROVIDER_SITE_OTHER): Payer: Managed Care, Other (non HMO)

## 2020-11-28 DIAGNOSIS — R0789 Other chest pain: Secondary | ICD-10-CM | POA: Diagnosis not present

## 2020-11-28 LAB — ECHOCARDIOGRAM COMPLETE
AR max vel: 2.39 cm2
AV Area VTI: 2.34 cm2
AV Area mean vel: 2.48 cm2
AV Mean grad: 2 mmHg
AV Peak grad: 4.9 mmHg
Ao pk vel: 1.11 m/s
Area-P 1/2: 3.74 cm2
Calc EF: 65.6 %
S' Lateral: 2.5 cm
Single Plane A2C EF: 65.6 %
Single Plane A4C EF: 61.1 %

## 2020-12-09 DIAGNOSIS — R0602 Shortness of breath: Secondary | ICD-10-CM | POA: Insufficient documentation

## 2021-01-15 ENCOUNTER — Telehealth: Payer: Self-pay | Admitting: Obstetrics and Gynecology

## 2021-01-15 NOTE — Telephone Encounter (Signed)
Pt called stating that she has been off her birth control for 2 months now- she has been gaining weight- about 10 lbs now- pt is asking if she can get some lbs done- check thyroid or hormones. Please advise. Reminded pt she has upcoming apt on 01-10

## 2021-01-15 NOTE — Telephone Encounter (Signed)
Pt can request provider run labs on day of appt if she would still like them done.

## 2021-02-03 ENCOUNTER — Encounter: Payer: Managed Care, Other (non HMO) | Admitting: Obstetrics and Gynecology

## 2021-02-07 ENCOUNTER — Encounter: Payer: Self-pay | Admitting: Emergency Medicine

## 2021-02-07 ENCOUNTER — Emergency Department
Admission: EM | Admit: 2021-02-07 | Discharge: 2021-02-07 | Disposition: A | Discharge status: Home / Self Care | Payer: Managed Care, Other (non HMO) | Attending: Emergency Medicine | Admitting: Emergency Medicine

## 2021-02-07 ENCOUNTER — Other Ambulatory Visit: Payer: Self-pay

## 2021-02-07 DIAGNOSIS — R1012 Left upper quadrant pain: Secondary | ICD-10-CM | POA: Diagnosis not present

## 2021-02-07 DIAGNOSIS — R1011 Right upper quadrant pain: Secondary | ICD-10-CM | POA: Diagnosis not present

## 2021-02-07 DIAGNOSIS — R110 Nausea: Secondary | ICD-10-CM | POA: Diagnosis not present

## 2021-02-07 LAB — COMPREHENSIVE METABOLIC PANEL
ALT: 28 U/L (ref 0–44)
AST: 30 U/L (ref 15–41)
Albumin: 3.9 g/dL (ref 3.5–5.0)
Alkaline Phosphatase: 52 U/L (ref 38–126)
Anion gap: 5 (ref 5–15)
BUN: 14 mg/dL (ref 6–20)
CO2: 26 mmol/L (ref 22–32)
Calcium: 9.2 mg/dL (ref 8.9–10.3)
Chloride: 104 mmol/L (ref 98–111)
Creatinine, Ser: 0.87 mg/dL (ref 0.44–1.00)
GFR, Estimated: >60 mL/min (ref >60)
Glucose, Bld: 100 mg/dL — ABNORMAL HIGH (ref 70–99)
Potassium: 3.3 mmol/L — ABNORMAL LOW (ref 3.5–5.1)
Sodium: 135 mmol/L (ref 135–145)
Total Bilirubin: 0.5 mg/dL (ref 0.3–1.2)
Total Protein: 7.7 g/dL (ref 6.5–8.1)

## 2021-02-07 LAB — CBC
HCT: 39.1 % (ref 36.0–46.0)
Hemoglobin: 12.9 g/dL (ref 12.0–15.0)
MCH: 31.6 pg (ref 26.0–34.0)
MCHC: 33 g/dL (ref 30.0–36.0)
MCV: 95.8 fL (ref 80.0–100.0)
Platelets: 287 10*3/uL (ref 150–400)
RBC: 4.08 MIL/uL (ref 3.87–5.11)
RDW: 12.2 % (ref 11.5–15.5)
WBC: 6.8 10*3/uL (ref 4.0–10.5)
nRBC: 0 % (ref 0.0–0.2)

## 2021-02-07 LAB — URINALYSIS, ROUTINE W REFLEX MICROSCOPIC
Bilirubin Urine: NEGATIVE
Glucose, UA: NEGATIVE mg/dL
Hgb urine dipstick: NEGATIVE
Ketones, ur: NEGATIVE mg/dL
Leukocytes,Ua: NEGATIVE
Nitrite: NEGATIVE
Protein, ur: NEGATIVE mg/dL
Specific Gravity, Urine: 1.01 (ref 1.005–1.030)
pH: 6.5 (ref 5.0–8.0)

## 2021-02-07 LAB — LIPASE, BLOOD: Lipase: 37 U/L (ref 11–51)

## 2021-02-07 LAB — PREGNANCY, URINE: Preg Test, Ur: NEGATIVE

## 2021-02-07 NOTE — Discharge Instructions (Addendum)
Take 40 mg of Pepcid once daily. You can take 50 mg of meclizine for dizziness. You can continue to use your triamcinolone no spray.

## 2021-02-07 NOTE — ED Provider Notes (Signed)
[facility] Provider Note  Patient Contact: 6:16 PM (approximate)   History   Nausea   HPI  Cynthia Mcintosh is a 36 y.o. female presents to the emergency department with right upper and left upper quadrant abdominal discomfort as well as nausea that has occurred since patient had COVID-19 in December.  She reports that the nausea kept her from going to work today.  She denies fever and chills.  She reports that she has a history of GERD and states that her current symptoms feel similar.  She denies chest pain, chest tightness and shortness of breath.  Denies vomiting at home.  Denies excessive use of anti-inflammatories or alcohol.      Physical Exam   Triage Vital Signs: ED Triage Vitals  Enc Vitals Group     BP 02/07/21 1024 (!) 148/87     Pulse Rate 02/07/21 1024 (!) 124     Resp 02/07/21 1024 18     Temp 02/07/21 1024 98.5 F (36.9 C)     Temp Source 02/07/21 1024 Oral     SpO2 02/07/21 1024 100 %     Weight 02/07/21 1008 157 lb 6.5 oz (71.4 kg)     Height 02/07/21 1008 5\' 8"  (1.727 m)     Head Circumference --      Peak Flow --      Pain Score 02/07/21 1008 0     Pain Loc --      Pain Edu? --      Excl. in [place]? --     Most recent vital signs: Vitals:   02/07/21 1024 02/07/21 1538  BP: (!) 148/87 114/78  Pulse: (!) 124 87  Resp: 18 16  Temp: 98.5 F (36.9 C)   SpO2: 100% 100%     General: Alert and in no acute distress. Eyes:  PERRL. EOMI. Head: No acute traumatic findings ENT:      Ears: Tms are pearly.       Nose: No congestion/rhinnorhea.      Mouth/Throat: Mucous membranes are moist. Neck: No stridor. No cervical spine tenderness to palpation. Cardiovascular:  Good peripheral perfusion Respiratory: Normal respiratory effort without tachypnea or retractions. Lungs CTAB. Good air entry to the bases with no decreased or absent breath sounds. Gastrointestinal: Bowel sounds 4 quadrants. Soft and nontender to palpation. No guarding  or rigidity. No palpable masses. No distention. No CVA tenderness. Musculoskeletal: Full range of motion to all extremities.  Neurologic:  No gross focal neurologic deficits are appreciated.  Skin:   No rash noted Other:   ED Results / Procedures / Treatments   Labs (all labs ordered are listed, but only abnormal results are displayed) Labs Reviewed  COMPREHENSIVE METABOLIC PANEL - Abnormal; Notable for the following components:      Result Value   Potassium 3.3 (*)    Glucose, Bld 100 (*)    All other components within normal limits  URINALYSIS, ROUTINE W REFLEX MICROSCOPIC - Abnormal; Notable for the following components:   Bacteria, UA MANY (*)    All other components within normal limits  LIPASE, BLOOD  CBC  PREGNANCY, URINE  POC URINE PREG, ED        MEDICATIONS ORDERED IN ED: Medications - No data to display   IMPRESSION / MDM / [place] / ED COURSE  I reviewed the triage vital signs and the nursing notes.  Differential diagnosis includes, but is not limited to, gastritis, GERD, pregnancy, pancreatitis, cholecystitis, gallstones, UTI...  Assessment and plan Acid reflux 37 year old female presents to the emergency department with nausea as well as right upper and left upper quadrant abdominal discomfort.  Vital signs were reassuring at triage.  On physical exam, patient was alert, active and nontoxic-appearing with no reproducible tenderness on exam.  CBC and CMP were reassuring.  Urinalysis did show many bacteria and mucus the patient states that she did not follow clean-catch protocol.  Urine pregnancy test was negative.  Given absence of pain on physical exam and normal white blood cell count, decreased suspicion for cholecystitis.  Lipase was within reference range, decreasing suspicion for pancreatitis.  Given likely dirty catch and absence of dysuria and increased urinary frequency, low suspicion for UTI.  Suspect  acid reflux as source of discomfort as patient states that she has not been taking an acids consistently.  We will start patient on Pepcid 40 mg once daily.  Return precautions were given to return with new or worsening symptoms.      FINAL CLINICAL IMPRESSION(S) / ED DIAGNOSES   Final diagnoses:  Nausea     Rx / DC Orders   ED Discharge Orders     None        Note:  This document was prepared using Dragon voice recognition software and may include unintentional dictation errors.   Vallarie Mare [place], PA-C 02/07/21 1820    Harvest Dark, MD 02/09/21 1352

## 2021-02-07 NOTE — ED Triage Notes (Signed)
C/O nausea and lightheaded.  States nausea has been ongoing to since COVID diagnosis at the end of December.  AAOx3.  Skin warm and dry. NAD

## 2021-02-07 NOTE — ED Notes (Signed)
Pt to ED for ongoing indigestion, slight weakness and dizziness since having Covid in December.  Has been taking TUMS and zofran for GERD and nausea. Pt feels nauseous especially in mornings when gets up and feels slightly more lightheaded during day at work when moving around.    Pt had covid diagnosis 12/30.   Pt is eating light, crackers and cereal mostly. Is drinking PO fluids without problem.  Denies vomiting, diarrhea and constipation. LNMP was 01/20/21. Takes birth control, last sexual activity was 9/22.

## 2021-02-07 NOTE — ED Notes (Signed)
Called lab to see if they can run urine preg on previous collection. They will run on previous collection.

## 2021-02-17 ENCOUNTER — Other Ambulatory Visit: Payer: Self-pay

## 2021-02-17 ENCOUNTER — Encounter: Payer: Self-pay | Admitting: Obstetrics and Gynecology

## 2021-02-17 ENCOUNTER — Ambulatory Visit (INDEPENDENT_AMBULATORY_CARE_PROVIDER_SITE_OTHER): Payer: Managed Care, Other (non HMO) | Admitting: Obstetrics and Gynecology

## 2021-02-17 VITALS — BP 123/84 | HR 92 | Ht 68.0 in | Wt 163.0 lb

## 2021-02-17 DIAGNOSIS — R5383 Other fatigue: Secondary | ICD-10-CM | POA: Diagnosis not present

## 2021-02-17 DIAGNOSIS — Z113 Encounter for screening for infections with a predominantly sexual mode of transmission: Secondary | ICD-10-CM | POA: Diagnosis not present

## 2021-02-17 DIAGNOSIS — D252 Subserosal leiomyoma of uterus: Secondary | ICD-10-CM

## 2021-02-17 DIAGNOSIS — Z3202 Encounter for pregnancy test, result negative: Secondary | ICD-10-CM | POA: Diagnosis not present

## 2021-02-17 NOTE — Addendum Note (Signed)
Addended by: Elta Guadeloupe on: 02/17/2021 04:32 PM   Modules accepted: Orders

## 2021-02-17 NOTE — Progress Notes (Signed)
° ° °  GYNECOLOGY PROGRESS NOTE  Subjective:    Patient ID: Cynthia Mcintosh, female    DOB: October 01, 1985, 36 y.o.   MRN: [id number]  HPI  Patient is a 36 y.o. G0P0000 female who presents for STD screening. Just desires routine check. Denies any recent exposure. Also desires UPT today.   Also desires Vitamin levels checked.  Notes feeling more fatigued over the past few weeks.   Reports that she has been off her birth control pills (Slynd) for the past 3 months (managing menstrual cycles, fibroids).  Now on a herbalist regimen (managed by an herbalist in [place]).  Notes menstrual cycles still associated with moderate cramping, but cycle length has decreased.   The following portions of the patient's history were reviewed and updated as appropriate: allergies, current medications, past family history, past medical history, past social history, past surgical history, and problem list.  Review of Systems Pertinent items noted in HPI and remainder of comprehensive ROS otherwise negative.   Objective:   Blood pressure 123/84, pulse 92, height 5\' 8"  (1.727 m), weight 163 lb (73.9 kg), last menstrual period 01/20/2021, SpO2 100 %.  Body mass index is 24.78 kg/m. General appearance: alert and no distress Abdomen: soft, non-tender; bowel sounds normal; no masses,  no organomegaly Pelvic: blind vaginal swab performed.    Assessment:   1. Screening for STD (sexually transmitted disease)   2. Pregnancy examination or test, negative result   3. Fatigue, unspecified type   4. Fibroids, subserous      Plan:    1. Screening for STD (sexually transmitted disease) - Cervicovaginal ancillary only - RPR - Hepatitis B Surface AntiBODY - HSV(herpes simplex vrs) 1+2 ab-IgG - HIV Antibody (routine testing w rflx) - Hepatitis C antibody  2. Pregnancy examination or test, negative result - UPT performed today, negative  3. Fatigue, unspecified type - Vitamin D 1,25 dihydroxy - TSH - Vitamin  B12  4. Fibroids, subserous - US PELVIC COMPLETE WITH TRANSVAGINAL; Future. To be performed in 3 months.     Rubie Maid, MD [facility]

## 2021-02-19 LAB — GC/CHLAMYDIA PROBE AMP
Chlamydia trachomatis, NAA: NEGATIVE
Neisseria Gonorrhoeae by PCR: NEGATIVE

## 2021-02-24 ENCOUNTER — Encounter: Payer: Self-pay | Admitting: Obstetrics and Gynecology

## 2021-02-24 ENCOUNTER — Telehealth: Payer: Self-pay | Admitting: Obstetrics and Gynecology

## 2021-02-24 NOTE — Telephone Encounter (Signed)
Pt is asking how she can get her results released to where she can see from her portal.

## 2021-03-05 ENCOUNTER — Other Ambulatory Visit: Payer: Self-pay | Admitting: Internal Medicine

## 2021-03-05 ENCOUNTER — Other Ambulatory Visit (HOSPITAL_COMMUNITY): Payer: Self-pay | Admitting: Internal Medicine

## 2021-03-05 DIAGNOSIS — R109 Unspecified abdominal pain: Secondary | ICD-10-CM

## 2021-03-05 LAB — RPR: RPR Ser Ql: NONREACTIVE

## 2021-03-05 LAB — VITAMIN D 1,25 DIHYDROXY
Vitamin D 1, 25 (OH)2 Total: 46 pg/mL
Vitamin D2 1, 25 (OH)2: <10 pg/mL
Vitamin D3 1, 25 (OH)2: 41 pg/mL

## 2021-03-05 LAB — HEPATITIS B SURFACE ANTIBODY,QUALITATIVE: Hep B Surface Ab, Qual: REACTIVE

## 2021-03-05 LAB — TSH: TSH: 1.57 u[IU]/mL (ref 0.450–4.500)

## 2021-03-05 LAB — HIV ANTIBODY (ROUTINE TESTING W REFLEX): HIV Screen 4th Generation wRfx: NONREACTIVE

## 2021-03-05 LAB — VITAMIN B12: Vitamin B-12: 899 pg/mL (ref 232–1245)

## 2021-03-05 LAB — HEPATITIS C ANTIBODY: Hep C Virus Ab: <0.1 s/co ratio (ref 0.0–0.9)

## 2021-03-05 LAB — HSV(HERPES SIMPLEX VRS) I + II AB-IGG
HSV 1 Glycoprotein G Ab, IgG: <0.91 index (ref 0.00–0.90)
HSV 2 IgG, Type Spec: <0.91 index (ref 0.00–0.90)

## 2021-03-11 ENCOUNTER — Ambulatory Visit
Admission: RE | Admit: 2021-03-11 | Discharge: 2021-03-11 | Disposition: A | Discharge status: Home / Self Care | Payer: Managed Care, Other (non HMO) | Source: Ambulatory Visit | Attending: Internal Medicine | Admitting: Internal Medicine

## 2021-03-11 ENCOUNTER — Other Ambulatory Visit: Payer: Self-pay

## 2021-03-11 DIAGNOSIS — R109 Unspecified abdominal pain: Secondary | ICD-10-CM | POA: Diagnosis present

## 2021-03-21 ENCOUNTER — Encounter: Payer: Self-pay | Admitting: Obstetrics and Gynecology

## 2021-04-16 ENCOUNTER — Encounter: Payer: Self-pay | Admitting: Obstetrics and Gynecology

## 2021-04-17 ENCOUNTER — Encounter: Payer: Self-pay | Admitting: Obstetrics and Gynecology

## 2021-07-03 ENCOUNTER — Encounter: Payer: Self-pay | Admitting: Obstetrics and Gynecology

## 2021-07-30 ENCOUNTER — Encounter: Payer: Self-pay | Admitting: Obstetrics and Gynecology

## 2021-07-30 ENCOUNTER — Ambulatory Visit (INDEPENDENT_AMBULATORY_CARE_PROVIDER_SITE_OTHER): Payer: Managed Care, Other (non HMO) | Admitting: Obstetrics and Gynecology

## 2021-07-30 VITALS — BP 129/100 | HR 97 | Wt 155.3 lb

## 2021-07-30 DIAGNOSIS — Z113 Encounter for screening for infections with a predominantly sexual mode of transmission: Secondary | ICD-10-CM

## 2021-07-30 NOTE — Progress Notes (Signed)
    GYNECOLOGY PROGRESS NOTE   Patient ID: Cynthia Mcintosh, female    DOB: [birth year], 36 y.o.   MRN: [id number]   Patient is a 36 y.o. G0P0000 female who presents for STD testing. Patient came to have std labs drawn. She had no symptoms. 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   The following portions of the patient's history were reviewed and updated as appropriate: allergies, current medications, past family history, past medical history, past social history, past surgical history, and problem list.  Will follow up with results via mychart or call if any results are positive.

## 2021-07-31 LAB — VIRAL HEPATITIS HBV, HCV
HCV Ab: NONREACTIVE
Hep B Core Total Ab: NEGATIVE
Hep B Surface Ab, Qual: REACTIVE
Hepatitis B Surface Ag: NEGATIVE

## 2021-07-31 LAB — HIV ANTIBODY (ROUTINE TESTING W REFLEX): HIV Screen 4th Generation wRfx: NONREACTIVE

## 2021-07-31 LAB — HSV(HERPES SIMPLEX VRS) I + II AB-IGG
HSV 1 Glycoprotein G Ab, IgG: <0.91 index (ref 0.00–0.90)
HSV 2 IgG, Type Spec: <0.91 index (ref 0.00–0.90)

## 2021-07-31 LAB — RPR: RPR Ser Ql: NONREACTIVE

## 2021-07-31 LAB — HCV INTERPRETATION

## 2021-08-01 LAB — NUSWAB VAGINITIS PLUS (VG+)
Candida albicans, NAA: NEGATIVE
Candida glabrata, NAA: NEGATIVE
Chlamydia trachomatis, NAA: NEGATIVE
Neisseria gonorrhoeae, NAA: NEGATIVE
Trich vag by NAA: NEGATIVE

## 2021-08-02 ENCOUNTER — Emergency Department
Admission: EM | Admit: 2021-08-02 | Discharge: 2021-08-02 | Disposition: A | Discharge status: Home / Self Care | Payer: Managed Care, Other (non HMO) | Attending: Emergency Medicine | Admitting: Emergency Medicine

## 2021-08-02 ENCOUNTER — Other Ambulatory Visit: Payer: Self-pay

## 2021-08-02 DIAGNOSIS — Z8616 Personal history of COVID-19: Secondary | ICD-10-CM | POA: Insufficient documentation

## 2021-08-02 DIAGNOSIS — H81399 Other peripheral vertigo, unspecified ear: Secondary | ICD-10-CM | POA: Diagnosis present

## 2021-08-02 LAB — CBC
HCT: 40.5 % (ref 36.0–46.0)
Hemoglobin: 13 g/dL (ref 12.0–15.0)
MCH: 30.4 pg (ref 26.0–34.0)
MCHC: 32.1 g/dL (ref 30.0–36.0)
MCV: 94.8 fL (ref 80.0–100.0)
Platelets: 243 10*3/uL (ref 150–400)
RBC: 4.27 MIL/uL (ref 3.87–5.11)
RDW: 12.8 % (ref 11.5–15.5)
WBC: 8.7 10*3/uL (ref 4.0–10.5)
nRBC: 0 % (ref 0.0–0.2)

## 2021-08-02 LAB — BASIC METABOLIC PANEL
Anion gap: 6 (ref 5–15)
BUN: 12 mg/dL (ref 6–20)
CO2: 24 mmol/L (ref 22–32)
Calcium: 9.1 mg/dL (ref 8.9–10.3)
Chloride: 109 mmol/L (ref 98–111)
Creatinine, Ser: 0.87 mg/dL (ref 0.44–1.00)
GFR, Estimated: >60 mL/min (ref >60)
Glucose, Bld: 104 mg/dL — ABNORMAL HIGH (ref 70–99)
Potassium: 3.2 mmol/L — ABNORMAL LOW (ref 3.5–5.1)
Sodium: 139 mmol/L (ref 135–145)

## 2021-08-02 LAB — URINALYSIS, ROUTINE W REFLEX MICROSCOPIC
Bilirubin Urine: NEGATIVE
Glucose, UA: NEGATIVE mg/dL
Ketones, ur: NEGATIVE mg/dL
Leukocytes,Ua: NEGATIVE
Nitrite: NEGATIVE
Protein, ur: NEGATIVE mg/dL
Specific Gravity, Urine: 1.003 — ABNORMAL LOW (ref 1.005–1.030)
pH: 6 (ref 5.0–8.0)

## 2021-08-02 LAB — POC URINE PREG, ED: Preg Test, Ur: NEGATIVE

## 2021-08-02 LAB — CBG MONITORING, ED: Glucose-Capillary: 99 mg/dL (ref 70–99)

## 2021-08-02 MED ORDER — PSEUDOEPHEDRINE HCL 60 MG PO TABS: 60.0000 mg | ORAL_TABLET | Freq: Four times a day (QID) | ORAL | 0 refills | Status: DC | PRN

## 2021-08-02 MED ORDER — MECLIZINE HCL 25 MG PO TABS: 25.0000 mg | ORAL_TABLET | Freq: Three times a day (TID) | ORAL | 0 refills | Status: DC | PRN

## 2021-08-02 NOTE — Discharge Instructions (Addendum)
Continue taking the meclizine 25 mg up to 3 times daily for dizziness and the prescribed pseudoephedrine to decrease congestion.  Follow-up with [facility] as needed.  Return to the ER for new, worsening, or persistent severe dizziness, nausea or vomiting, severe headache, difficulty walking, or any other new or worsening symptoms that concern you.

## 2021-08-02 NOTE — ED Provider Notes (Signed)
[facility] Provider Note    Event Date/Time   First MD Initiated Contact with Patient 08/02/21 [phone]     (approximate)   History   Dizziness   HPI  Cynthia Mcintosh is a 36 y.o. female with no active medical problems who presents with dizziness over proximal last 2 to 3 days, described as a sensation of the room spinning and causing her to have to hold onto things.  It occurs when she turns her head and moves quickly.  It is associated with mild nausea.  The patient is also reported some pressure in both ears.  The patient states she has had this once before when she had COVID and also had upper respiratory and ear congestion.  She denies any headache, vomiting, fever, weakness or numbness, or other acute symptoms.  She has taken over-the-counter meclizine once daily with minimal relief.     Physical Exam   Triage Vital Signs: ED Triage Vitals  Enc Vitals Group     BP 08/02/21 0452 (!) 150/90     Pulse Rate 08/02/21 0452 (!) 115     Resp 08/02/21 0452 17     Temp 08/02/21 0452 98.7 F (37.1 C)     Temp Source 08/02/21 0452 Oral     SpO2 08/02/21 0452 98 %     Weight 08/02/21 0425 152 lb (68.9 kg)     Height 08/02/21 0425 '5\' 8"'$  (1.727 m)     Head Circumference --      Peak Flow --      Pain Score 08/02/21 0424 0     Pain Loc --      Pain Edu? --      Excl. in [place]? --     Most recent vital signs: Vitals:   08/02/21 0452 08/02/21 0646  BP: (!) 150/90 (!) 118/94  Pulse: (!) 115 83  Resp: 17 17  Temp: 98.7 F (37.1 C)   SpO2: 98% 100%     General: Alert and oriented, comfortable appearing. CV:  Good peripheral perfusion.  Resp:  Normal effort.  Abd:  No distention.  Other:  EOMI.  PERRLA.  Cranial nerves III through XII grossly intact.  Motor intact in all extremities.  No pronator drift.  No ataxia on finger-to-nose.  Normal gait.  TMs and ear canals normal bilaterally   ED Results / Procedures / Treatments   Labs (all labs ordered are  listed, but only abnormal results are displayed) Labs Reviewed  BASIC METABOLIC PANEL - Abnormal; Notable for the following components:      Result Value   Potassium 3.2 (*)    Glucose, Bld 104 (*)    All other components within normal limits  URINALYSIS, ROUTINE W REFLEX MICROSCOPIC - Abnormal; Notable for the following components:   Color, Urine STRAW (*)    APPearance CLEAR (*)    Specific Gravity, Urine 1.003 (*)    Hgb urine dipstick SMALL (*)    Bacteria, UA RARE (*)    All other components within normal limits  CBC  CBG MONITORING, ED  POC URINE PREG, ED     EKG  ED ECG REPORT I, Arta Silence, the attending physician, personally viewed and interpreted this ECG.  Date: 08/02/2021 EKG Time: 04 28 Rate: 102 Rhythm: normal sinus rhythm QRS Axis: normal Intervals: normal ST/T Wave abnormalities: Nonspecific T wave abnormalities Narrative Interpretation: no evidence of acute ischemia; no significant change when compared to EKG of 02/07/2021  RADIOLOGY   PROCEDURES:  Critical Care performed: No  Procedures   MEDICATIONS ORDERED IN ED: Medications - No data to display   IMPRESSION / MDM / [place] / ED COURSE  I reviewed the triage vital signs and the nursing notes.  35 year old female with no active medical problems presents with vertigo type dizziness over the last several days.  Differential diagnosis includes, but is not limited to, BPPV, labyrinthitis or other peripheral vertigo.  The patient has no signs or symptoms to suggest central vertigo.  Her neurologic exam is normal.  There is no evidence of cardiac cause.  Patient's presentation is most consistent with acute complicated illness / injury requiring diagnostic workup.  Lab work-up is unremarkable.  Urinalysis shows no acute findings.  Electrolytes are normal except for borderline low potassium.  There is no leukocytosis.  EKG is nonischemic.  At this time, there is no  indication for further ED work-up.  The patient is stable for discharge home.  I instructed her to continue meclizine as needed, I have prescribed pseudoephedrine to decrease the pressure and congestion in her ears which is likely contributing, and gave her ENT referral.  Return precautions given, she expresses understanding.   FINAL CLINICAL IMPRESSION(S) / ED DIAGNOSES   Final diagnoses:  Peripheral vertigo, unspecified laterality     Rx / DC Orders   ED Discharge Orders          Ordered    meclizine (ANTIVERT) 25 MG tablet  3 times daily PRN        08/02/21 0811    pseudoephedrine (SUDAFED) 60 MG tablet  Every 6 hours PRN        08/02/21 5993             Note:  This document was prepared using Dragon voice recognition software and may include unintentional dictation errors.    Arta Silence, MD 08/02/21 [phone]

## 2021-09-14 DIAGNOSIS — J4550 Severe persistent asthma, uncomplicated: Secondary | ICD-10-CM | POA: Insufficient documentation

## 2021-10-03 ENCOUNTER — Emergency Department
Admission: EM | Admit: 2021-10-03 | Discharge: 2021-10-03 | Disposition: A | Discharge status: Home / Self Care | Payer: Managed Care, Other (non HMO) | Attending: Emergency Medicine | Admitting: Emergency Medicine

## 2021-10-03 ENCOUNTER — Other Ambulatory Visit: Payer: Self-pay

## 2021-10-03 DIAGNOSIS — J45909 Unspecified asthma, uncomplicated: Secondary | ICD-10-CM | POA: Diagnosis not present

## 2021-10-03 DIAGNOSIS — Z20822 Contact with and (suspected) exposure to covid-19: Secondary | ICD-10-CM | POA: Insufficient documentation

## 2021-10-03 DIAGNOSIS — R42 Dizziness and giddiness: Secondary | ICD-10-CM | POA: Diagnosis present

## 2021-10-03 DIAGNOSIS — R110 Nausea: Secondary | ICD-10-CM | POA: Diagnosis not present

## 2021-10-03 DIAGNOSIS — K9049 Malabsorption due to intolerance, not elsewhere classified: Secondary | ICD-10-CM

## 2021-10-03 LAB — COMPREHENSIVE METABOLIC PANEL
ALT: 15 U/L (ref 0–44)
AST: 23 U/L (ref 15–41)
Albumin: 4.3 g/dL (ref 3.5–5.0)
Alkaline Phosphatase: 52 U/L (ref 38–126)
Anion gap: 7 (ref 5–15)
BUN: 14 mg/dL (ref 6–20)
CO2: 26 mmol/L (ref 22–32)
Calcium: 9.6 mg/dL (ref 8.9–10.3)
Chloride: 106 mmol/L (ref 98–111)
Creatinine, Ser: 0.84 mg/dL (ref 0.44–1.00)
GFR, Estimated: >60 mL/min (ref >60)
Glucose, Bld: 106 mg/dL — ABNORMAL HIGH (ref 70–99)
Potassium: 3.4 mmol/L — ABNORMAL LOW (ref 3.5–5.1)
Sodium: 139 mmol/L (ref 135–145)
Total Bilirubin: 0.6 mg/dL (ref 0.3–1.2)
Total Protein: 8.5 g/dL — ABNORMAL HIGH (ref 6.5–8.1)

## 2021-10-03 LAB — URINALYSIS, ROUTINE W REFLEX MICROSCOPIC
Bilirubin Urine: NEGATIVE
Glucose, UA: NEGATIVE mg/dL
Hgb urine dipstick: NEGATIVE
Ketones, ur: NEGATIVE mg/dL
Leukocytes,Ua: NEGATIVE
Nitrite: NEGATIVE
Protein, ur: NEGATIVE mg/dL
Specific Gravity, Urine: 1.012 (ref 1.005–1.030)
pH: 7 (ref 5.0–8.0)

## 2021-10-03 LAB — CBC
HCT: 40.7 % (ref 36.0–46.0)
Hemoglobin: 13.1 g/dL (ref 12.0–15.0)
MCH: 31 pg (ref 26.0–34.0)
MCHC: 32.2 g/dL (ref 30.0–36.0)
MCV: 96.2 fL (ref 80.0–100.0)
Platelets: 240 10*3/uL (ref 150–400)
RBC: 4.23 MIL/uL (ref 3.87–5.11)
RDW: 12.6 % (ref 11.5–15.5)
WBC: 8.7 10*3/uL (ref 4.0–10.5)
nRBC: 0 % (ref 0.0–0.2)

## 2021-10-03 LAB — POC URINE PREG, ED: Preg Test, Ur: NEGATIVE

## 2021-10-03 LAB — SARS CORONAVIRUS 2 BY RT PCR: SARS Coronavirus 2 by RT PCR: NEGATIVE

## 2021-10-03 LAB — TROPONIN I (HIGH SENSITIVITY): Troponin I (High Sensitivity): <2 ng/L (ref <18)

## 2021-10-03 LAB — LIPASE, BLOOD: Lipase: 36 U/L (ref 11–51)

## 2021-10-03 NOTE — Discharge Instructions (Signed)
As discussed, you may take the famotidine if you have similar symptoms in the future.  You should consider avoiding foods with high gluten if these are causing GI upset.  Follow-up with your regular doctor.  Return to the ER for new, worsening, or persistent severe nausea or vomiting, abdominal pain, dizziness, or any other new or worsening symptoms that concern you.

## 2021-10-03 NOTE — ED Triage Notes (Addendum)
Pt states she ate a vegan meal last night and 30 minutes later had nausea which lasted all night. Pt reports today she started feeling dizzy, head pressure, chest pressure, anxiety, and continued nausea without relief from OTC medication/GERD medication. Pt reports she had similar s/s with covid, and has history of fibroids. Pt denies throwing up or having diarrhea. Pt denies abdominal pain but states she has history of digestive issues. Pt is AOX4, NAD noted.

## 2021-10-03 NOTE — ED Provider Notes (Signed)
[facility] Provider Note    Event Date/Time   First MD Initiated Contact with Patient 10/03/21 1915     (approximate)   History   Nausea   HPI  Airyonna Franklyn is a 36 y.o. female with a history of asthma who presents with multiple symptoms after eating a vegan meal yesterday.  The patient states that she had nausea that started shortly after eating the meal and lasted throughout the night.  It is not relieved by famotidine and Pepto-Bismol that she took.  Today she started feeling lightheaded, some pressure in her head, chest pressure, and anxiety.  The nausea continued.  The symptoms resolved after a few hours.  She then went to work and the symptoms started again.  She states that the symptoms have now once again resolved.  She reports a history of previous digestive issues and other symptoms with certain foods.  She thought that she may have celiac disease.  She states that she has had an outpatient GI work-up including a test for celiac disease that was negative.  Previously this happened after eating lo mein.  The patient denies any active chest pain, fever or chills, difficulty breathing, or any abdominal pain.  She has no diarrhea or change in her bowel movements.   Physical Exam   Triage Vital Signs: ED Triage Vitals  Enc Vitals Group     BP 10/03/21 1803 130/66     Pulse Rate 10/03/21 1803 100     Resp 10/03/21 1803 20     Temp 10/03/21 1803 98.6 F (37 C)     Temp Source 10/03/21 1803 Oral     SpO2 10/03/21 1803 100 %     Weight 10/03/21 1810 155 lb (70.3 kg)     Height 10/03/21 1810 '5\' 7"'$  (1.702 m)     Head Circumference --      Peak Flow --      Pain Score 10/03/21 1804 10     Pain Loc --      Pain Edu? --      Excl. in [place]? --     Most recent vital signs: Vitals:   10/03/21 1803 10/03/21 1958  BP: 130/66 126/64  Pulse: 100 91  Resp: 20 15  Temp: 98.6 F (37 C) 98.5 F (36.9 C)  SpO2: 100% 100%     General: Awake, no distress.   CV:  Good peripheral perfusion.  Resp:  Normal effort.  Abd:  No distention.  Other:  Moist mucous membranes.   ED Results / Procedures / Treatments   Labs (all labs ordered are listed, but only abnormal results are displayed) Labs Reviewed  COMPREHENSIVE METABOLIC PANEL - Abnormal; Notable for the following components:      Result Value   Potassium 3.4 (*)    Glucose, Bld 106 (*)    Total Protein 8.5 (*)    All other components within normal limits  URINALYSIS, ROUTINE W REFLEX MICROSCOPIC - Abnormal; Notable for the following components:   Color, Urine STRAW (*)    APPearance CLEAR (*)    All other components within normal limits  SARS CORONAVIRUS 2 BY RT PCR  LIPASE, BLOOD  CBC  POC URINE PREG, ED  TROPONIN I (HIGH SENSITIVITY)     EKG  ED ECG REPORT I, Arta Silence, the attending physician, personally viewed and interpreted this ECG.  Date: 10/03/2021 EKG Time: 1809 Rate: 83 Rhythm: normal sinus rhythm QRS Axis: normal Intervals: normal ST/T Wave  abnormalities: normal Narrative Interpretation: no evidence of acute ischemia   RADIOLOGY    PROCEDURES:  Critical Care performed: No  Procedures   MEDICATIONS ORDERED IN ED: Medications - No data to display   IMPRESSION / MDM / [place] / ED COURSE  I reviewed the triage vital signs and the nursing notes.  36 year old female with PMH as noted above presents with nausea and multiple other symptoms after eating a vegan meal last night.  The symptoms have all now resolved.  I reviewed the past medical records.  The patient was seen by me in the ED on 7/9 for an episode of dizziness that she states was similar.  Her work-up was negative at that time.  She was most recently seen by Dr. Murvin Natal from pulmonology on 7/26 due to asthma.  There are no available outpatient PMD or GI records.  Physical exam is unremarkable.  The vital signs are normal.  EKG shows no acute  abnormality.  Differential diagnosis includes, but is not limited to, gastritis, gastroenteritis, foodborne illness, food intolerance, celiac disease, autoimmune etiology, dehydration, electrolyte abnormality, viral syndrome.  Patient's presentation is most consistent with acute complicated illness / injury requiring diagnostic workup.  The patient's work-up is entirely within normal limits.  COVID is negative.  Electrolytes are normal.  LFTs and lipase are normal.  There is no leukocytosis.  Urinalysis is negative.  The patient's symptoms have resolved.  At this time, there is no indication for additional ED work-up or observation.  The patient feels well and would like to go home.  I counseled her on the results of the work-up and likely causes of her symptoms.  She agrees with pursuing further outpatient follow-up.  Return precautions given, and she expresses understanding.   FINAL CLINICAL IMPRESSION(S) / ED DIAGNOSES   Final diagnoses:  Lightheadedness  Nausea  Food intolerance     Rx / DC Orders   ED Discharge Orders     None        Note:  This document was prepared using Dragon voice recognition software and may include unintentional dictation errors.    Arta Silence, MD 10/03/21 2046

## 2021-10-08 ENCOUNTER — Encounter: Payer: Self-pay | Admitting: Obstetrics and Gynecology

## 2021-10-12 NOTE — Telephone Encounter (Signed)
Please see patient's request for intermittent leave for FMLA due to PMDD. We have to complete these every 6 months for her.

## 2021-10-20 ENCOUNTER — Encounter: Payer: Self-pay | Admitting: Internal Medicine

## 2021-10-21 ENCOUNTER — Telehealth: Payer: Self-pay | Admitting: Gastroenterology

## 2021-10-21 NOTE — Telephone Encounter (Signed)
Hi Dr. Bryan Lemma,   We received a referral for patient to be evaluated for Cynthia Mcintosh  possible IBS. She does have GI history with [facility] in [place], [place] an EGD was performed in 2021. Patient is requesting you personally to continue her GI care. Records were obtained for you to review and advise on scheduling.    Thanks

## 2021-11-04 NOTE — Telephone Encounter (Signed)
Previous records received and reviewed and notable for the following:  - 07/24/2019: EGD ([place], [place]): Indication bloating, diarrhea, belching.  Localized erythema at GEJ, Non-H. pylori gastritis.  Normal duodenum with benign biopsies - 03/11/2021: Abdominal ultrasound: Indication abdominal pain x1 month: Normal GB, CBD 3 mm, 2.4 x 2.2 x 2.0 hepatic hemangioma.  Otherwise normal liver, pancreas, spleen - 06/19/2021: Normal CMP, CBC.  Negative/normal celiac panel.  Normal food allergy testing.  Normal lipase - 09/16/2021: Negative/normal ova and parasite, Giardia, C. difficile, Salmonella, Campylobacter, E. coli, fecal lactoferrin - 10/03/2021: K3.4 otherwise normal BMP, normal liver enzymes, normal CBC with H/H 13.1/40.7, normal lipase  After records review, I am not sure there is an additional diagnostic work-up that I can offer and cannot accommodate this transfer at this time.

## 2021-11-11 NOTE — Telephone Encounter (Signed)
Patient has been advised of recommendations below, she was not happy with decision and stated she is in need of GI care and I advised her to possibly check with [facility].

## 2021-12-10 DIAGNOSIS — K9049 Malabsorption due to intolerance, not elsewhere classified: Secondary | ICD-10-CM | POA: Insufficient documentation

## 2022-01-02 ENCOUNTER — Other Ambulatory Visit: Payer: Self-pay

## 2022-01-02 ENCOUNTER — Emergency Department
Admission: EM | Admit: 2022-01-02 | Discharge: 2022-01-02 | Disposition: A | Discharge status: Home / Self Care | Payer: No Typology Code available for payment source | Attending: Emergency Medicine | Admitting: Emergency Medicine

## 2022-01-02 DIAGNOSIS — Z23 Encounter for immunization: Secondary | ICD-10-CM | POA: Insufficient documentation

## 2022-01-02 DIAGNOSIS — Z7721 Contact with and (suspected) exposure to potentially hazardous body fluids: Secondary | ICD-10-CM | POA: Diagnosis not present

## 2022-01-02 LAB — COMPREHENSIVE METABOLIC PANEL
ALT: 14 U/L (ref 0–44)
AST: 20 U/L (ref 15–41)
Albumin: 4.2 g/dL (ref 3.5–5.0)
Alkaline Phosphatase: 47 U/L (ref 38–126)
Anion gap: 5 (ref 5–15)
BUN: 9 mg/dL (ref 6–20)
CO2: 24 mmol/L (ref 22–32)
Calcium: 9.6 mg/dL (ref 8.9–10.3)
Chloride: 109 mmol/L (ref 98–111)
Creatinine, Ser: 0.68 mg/dL (ref 0.44–1.00)
GFR, Estimated: >60 mL/min (ref >60)
Glucose, Bld: 100 mg/dL — ABNORMAL HIGH (ref 70–99)
Potassium: 3.7 mmol/L (ref 3.5–5.1)
Sodium: 138 mmol/L (ref 135–145)
Total Bilirubin: 0.5 mg/dL (ref 0.3–1.2)
Total Protein: 8.2 g/dL — ABNORMAL HIGH (ref 6.5–8.1)

## 2022-01-02 LAB — RAPID HIV SCREEN (HIV 1/2 AB+AG)
HIV 1/2 Antibodies: NONREACTIVE
HIV-1 P24 Antigen - HIV24: NONREACTIVE

## 2022-01-02 LAB — POC URINE PREG, ED: Preg Test, Ur: NEGATIVE

## 2022-01-02 MED ORDER — TETANUS-DIPHTH-ACELL PERTUSSIS 5-2.5-18.5 LF-MCG/0.5 IM SUSY
0.5000 mL | PREFILLED_SYRINGE | Freq: Once | INTRAMUSCULAR | Status: AC
  Administered 2022-01-02: 0.5 mL via INTRAMUSCULAR
  Filled 2022-01-02: qty 0.5

## 2022-01-02 MED ORDER — ONDANSETRON 8 MG PO TBDP: 8.0000 mg | ORAL_TABLET | Freq: Three times a day (TID) | ORAL | 0 refills | Status: DC | PRN

## 2022-01-02 MED ORDER — ELVITEG-COBIC-EMTRICIT-TENOFAF 150-150-200-10 MG PO TABS: 1.0000 | ORAL_TABLET | Freq: Every day | ORAL | 0 refills | Status: DC

## 2022-01-02 MED ORDER — ELVITEG-COBIC-EMTRICIT-TENOFAF 150-150-200-10 MG PREPACK
1.0000 | ORAL_TABLET | Freq: Once | ORAL | Status: AC
Start: 2022-01-02 — End: 2022-01-02
  Administered 2022-01-02: 1 via ORAL
  Filled 2022-01-02 (×2): qty 1

## 2022-01-02 NOTE — ED Notes (Signed)
Called lab. Labs were never received. Will be drawn now for pt to discharge.

## 2022-01-02 NOTE — ED Triage Notes (Signed)
Pt works at [employer] and had a possible body fluid exposure where she got cut by a knife that was exposed to body fluids

## 2022-01-02 NOTE — Progress Notes (Signed)
CSW spoke with patient who stated she did not need any infectious disease resources and that she should be fine. Patient stated she was getting everything she needed from the emergency room.

## 2022-01-02 NOTE — Discharge Instructions (Addendum)
[facility] Infectious disease clinic  [place] [id number] [phone]

## 2022-01-02 NOTE — ED Provider Triage Note (Signed)
Emergency Medicine Provider Triage Evaluation Note  Cynthia Mcintosh , a 36 y.o. female  was evaluated in triage.  Pt complains of body fluid exposure at work.  Review of Systems  Positive:  Negative:   Physical Exam  BP 107/71   Pulse (!) 103   Temp 98.6 F (37 C)   Resp 20   Ht '5\' 8"'$  (1.727 m)   Wt 65.8 kg   SpO2 100%   BMI 22.05 kg/m  Gen:   Awake, no distress   Resp:  Normal effort  MSK:   Moves extremities without difficulty  Other:    Medical Decision Making  Medically screening exam initiated at 1:26 PM.  Appropriate orders placed.  F[place] Pho was informed that the remainder of the evaluation will be completed by another provider, this initial triage assessment does not replace that evaluation, and the importance of remaining in the ED until their evaluation is complete.     Versie Starks, PA-C 01/02/22 1326

## 2022-01-02 NOTE — ED Provider Notes (Signed)
[facility] Provider Note   Event Date/Time   First MD Initiated Contact with Patient 01/02/22 1504     (approximate) History  Body Fluid Exposure  HPI Manju Kulkarni is a 36 y.o. female with a past medical history of anxiety/depression and uterine fibroids who presents after a possible body fluid exposure at her employer where she works at [employer].  Patient states that she was working disposing of multiple vials of bodily fluids when she tried to scrape label off with a razor knife and ended up poking herself in the distal end of the right index finger.  Patient is concerned as she does not know what was on her glove or what was on the vials prior to poking herself in the finger.  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  Patient denies being up-to-date on her tetanus vaccination. ROS: Patient currently denies any vision changes, tinnitus, difficulty speaking, facial droop, sore throat, chest pain, shortness of breath, abdominal pain, nausea/vomiting/diarrhea, dysuria, or weakness/numbness/paresthesias in any extremity   Physical Exam  Triage Vital Signs: ED Triage Vitals  Enc Vitals Group     BP 01/02/22 1315 107/71     Pulse Rate 01/02/22 1315 (!) 103     Resp 01/02/22 1315 20     Temp 01/02/22 1315 98.6 F (37 C)     Temp src --      SpO2 01/02/22 1315 100 %     Weight 01/02/22 1316 145 lb (65.8 kg)     Height 01/02/22 1316 '5\' 8"'$  (1.727 m)     Head Circumference --      Peak Flow --      Pain Score 01/02/22 1331 0     Pain Loc --      Pain Edu? --      Excl. in [place]? --    Most recent vital signs: Vitals:   01/02/22 1315  BP: 107/71  Pulse: (!) 103  Resp: 20  Temp: 98.6 F (37 C)  SpO2: 100%   General: Awake, oriented x4. CV:  Good peripheral perfusion.  Resp:  Normal effort.  Abd:  No distention.  Other:  Middle-aged  African-American female sitting in chair in exam room in no acute distress.  There is a punctate wound to the medial aspect of the palmar side of the right index finger ED Results / Procedures / Treatments  Labs (all labs ordered are listed, but only abnormal results are displayed) Labs Reviewed  RAPID HIV SCREEN (HIV 1/2 AB+AG)  COMPREHENSIVE METABOLIC PANEL  HEPATITIS C ANTIBODY  HEPATITIS B SURFACE ANTIGEN  RPR   PROCEDURES: Critical Care performed: No Procedures MEDICATIONS ORDERED IN ED: Medications  elvitegravir-cobicistat-emtricitabine-tenofovir (GENVOYA) 150-150-200-10 Prepack 1 each (has no administration in time range)  Tdap (BOOSTRIX) injection 0.5 mL (has no administration in time range)   IMPRESSION / MDM / ASSESSMENT AND PLAN / ED COURSE  I reviewed the triage vital signs and the nursing notes.                             Patient's presentation is most consistent with acute presentation with potential threat to life or bodily function. Patient is a 36 year old female who presents as an employee of [employer] who was possibly exposed to infectious bodily fluids.  Patient denies up-to-date tetanus  vaccination however does state that she is up-to-date on all other vaccinations including hepatitis B.  As there is unknown status of the samples that patient may have been exposed to, she will receive postexposure prophylaxis for HIV as well as laboratory evaluation for HIV, hepatitis, and syphilis.  I will also arrange as an outpatient for follow-up for testing at 30 days as well. The patient has been reexamined and is ready to be discharged.  All diagnostic results have been reviewed and discussed with the patient/family.  Care plan has been outlined and the patient/family understands all current diagnoses, results, and treatment plans.  There are no new complaints, changes, or physical findings at this time.  All questions have been addressed and answered.  All medications, if any,  that were given while in the emergency department or any that are being prescribed have been reviewed with the patient/family.  All side effects and adverse reactions have been explained.  Patient was instructed to, and agrees to follow-up with their primary care physician as well as return to the emergency department if any new or worsening symptoms develop.   FINAL CLINICAL IMPRESSION(S) / ED DIAGNOSES   Final diagnoses:  Contact w and exposure to potentially hazardous body fluids   Rx / DC Orders   ED Discharge Orders          Ordered    elvitegravir-cobicistat-emtricitabine-tenofovir (GENVOYA) 150-150-200-10 MG TABS tablet  Daily with breakfast        01/02/22 1526           Note:  This document was prepared using Dragon voice recognition software and may include unintentional dictation errors.   Naaman Plummer, MD 01/02/22 [phone]

## 2022-01-03 LAB — RPR: RPR Ser Ql: NONREACTIVE

## 2022-01-03 LAB — HEPATITIS B SURFACE ANTIGEN: Hepatitis B Surface Ag: NONREACTIVE

## 2022-01-03 LAB — HEPATITIS C ANTIBODY: HCV Ab: NONREACTIVE

## 2022-01-23 ENCOUNTER — Emergency Department: Payer: Managed Care, Other (non HMO)

## 2022-01-23 ENCOUNTER — Emergency Department
Admission: EM | Admit: 2022-01-23 | Discharge: 2022-01-23 | Disposition: A | Discharge status: Home / Self Care | Payer: Managed Care, Other (non HMO) | Attending: Emergency Medicine | Admitting: Emergency Medicine

## 2022-01-23 ENCOUNTER — Other Ambulatory Visit: Payer: Self-pay

## 2022-01-23 DIAGNOSIS — G9331 Postviral fatigue syndrome: Secondary | ICD-10-CM | POA: Insufficient documentation

## 2022-01-23 DIAGNOSIS — R059 Cough, unspecified: Secondary | ICD-10-CM | POA: Diagnosis present

## 2022-01-23 DIAGNOSIS — J45909 Unspecified asthma, uncomplicated: Secondary | ICD-10-CM | POA: Insufficient documentation

## 2022-01-23 DIAGNOSIS — R058 Other specified cough: Secondary | ICD-10-CM

## 2022-01-23 LAB — BASIC METABOLIC PANEL
Anion gap: 9 (ref 5–15)
BUN: 13 mg/dL (ref 6–20)
CO2: 25 mmol/L (ref 22–32)
Calcium: 9.6 mg/dL (ref 8.9–10.3)
Chloride: 105 mmol/L (ref 98–111)
Creatinine, Ser: 0.81 mg/dL (ref 0.44–1.00)
GFR, Estimated: >60 mL/min (ref >60)
Glucose, Bld: 119 mg/dL — ABNORMAL HIGH (ref 70–99)
Potassium: 3.7 mmol/L (ref 3.5–5.1)
Sodium: 139 mmol/L (ref 135–145)

## 2022-01-23 LAB — TROPONIN I (HIGH SENSITIVITY): Troponin I (High Sensitivity): <2 ng/L (ref <18)

## 2022-01-23 LAB — CBC
HCT: 39.1 % (ref 36.0–46.0)
Hemoglobin: 12.7 g/dL (ref 12.0–15.0)
MCH: 30.5 pg (ref 26.0–34.0)
MCHC: 32.5 g/dL (ref 30.0–36.0)
MCV: 93.8 fL (ref 80.0–100.0)
Platelets: 253 10*3/uL (ref 150–400)
RBC: 4.17 MIL/uL (ref 3.87–5.11)
RDW: 12.4 % (ref 11.5–15.5)
WBC: 8.6 10*3/uL (ref 4.0–10.5)
nRBC: 0 % (ref 0.0–0.2)

## 2022-01-23 NOTE — ED Triage Notes (Addendum)
Pt to ED  from work for jitterness, CP, SOB, headache. Pt has been SOB all day. Pt states "I have anxiety and I felt like I was having an attack". I took a hit off my inhaler and took my anxiety medication. I took ativan with no relief. Pt can't sit still. Pt is texting on her phone and in no acute distress at this time. Pt was also seen at [facility] yesterday for same and was tested for COVID/flu and was NEG. Pt is scrolling on her phone while triaging.

## 2022-01-23 NOTE — ED Provider Notes (Signed)
[facility] Provider Note    Event Date/Time   First MD Initiated Contact with Patient 01/23/22 2113     (approximate)   History   Anxiety and Chest Pain   HPI  Cynthia Mcintosh is a 36 y.o. female reports a history of asthma and anxiety  About a little over a week ago she developed symptoms of cough fever chills.  This is largely resolved but she has been left with a slight cough and at times when she is coughing she will feel like her nose will become clogged and she will have trouble breathing  She typically uses a nasal steroid but is "between prescriptions" and reports when her nose gets clogged she has difficulty breathing and at start of triggers anxiety.  Tonight while at work she experienced the same started feeling she is having trouble breathing through her nose then also started feeling like a short of breath or tight feeling in her chest, and used her inhaler twice.  After using her albuterol inhaler she started feeling a bit shaky  She feels better now.  Right now just feels like her nasal passages remain clogged up  She is had some chest tightness but has resolved.  Denies any history of heart disease.  Does not take any birth control or hormones.  No long trips or travel no surgeries.  No history of blood clots no coughing up any blood.  Was seen by her doctor's office yesterday     Physical Exam   Triage Vital Signs: ED Triage Vitals  Enc Vitals Group     BP 01/23/22 1852 (!) 118/93     Pulse Rate 01/23/22 1852 84     Resp 01/23/22 1852 16     Temp 01/23/22 1852 98 F (36.7 C)     Temp Source 01/23/22 1852 Oral     SpO2 01/23/22 1852 100 %     Weight 01/23/22 1849 145 lb (65.8 kg)     Height 01/23/22 1849 '5\' 7"'$  (1.702 m)     Head Circumference --      Peak Flow --      Pain Score 01/23/22 1849 4     Pain Loc --      Pain Edu? --      Excl. in [place]? --     Most recent vital signs: Vitals:   01/23/22 1852  BP: (!) 118/93   Pulse: 84  Resp: 16  Temp: 98 F (36.7 C)  SpO2: 100%     General: Awake, no distress.  Very pleasant in no distress. CV:  Good peripheral perfusion.  Normal tones and rate Resp:  Normal effort.  Clear lungs bilaterally.  No wheezing.  Her work of breathing is normal. Abd:  No distention.  Does not appear gravid Other:  No lower extremity edema or swelling.   ED Results / Procedures / Treatments   Labs (all labs ordered are listed, but only abnormal results are displayed) Labs Reviewed  BASIC METABOLIC PANEL - Abnormal; Notable for the following components:      Result Value   Glucose, Bld 119 (*)    All other components within normal limits  CBC  POC URINE PREG, ED  TROPONIN I (HIGH SENSITIVITY)     EKG  Interpreted by me at 1900 heart rate 95 QRS 70 QTc 430 Normal sinus rhythm slight baseline artifact.  No evidence of acute ischemia.  Somewhat elevated voltages noted over inferior and lateral precordial QRS  complexes.  No evidence of ischemic abnormalities   RADIOLOGY Chest x-ray interpreted by me as normal     PROCEDURES:  Critical Care performed: No  Procedures   MEDICATIONS ORDERED IN ED: Medications - No data to display   IMPRESSION / MDM / [place] / ED COURSE  I reviewed the triage vital signs and the nursing notes.                              Differential diagnosis includes, but is not limited to, post viral cough, nasal congestion, sinusitis, x-ray no evidence of infiltrate, no ongoing fever, workup here quite reassuring including CBC metabolic panel and normal troponin.  She denies having chest pain reports she had a feeling of tightness associated with coughing and shortness of breath, but her symptoms seem very atypical for ACS and her troponin is normal without notable risk factors.  PERC negative for PE   Patient's presentation is most consistent with acute complicated illness / injury requiring diagnostic workup.  Note  reviewed from [facility] yesterday patient diagnosed with viral upper respiratory tract infection and iron deficient anemia.      Pulmonary Embolism Rule-out Criteria (PERC rule)                        If YES to ANY of the following, the PERC rule is not satisfied and cannot be used to rule out PE in this patient (consider d-dimer or imaging depending on pre-test probability).                      If NO to ALL of the following, AND the clinician's pre-test probability is <15%, the [facility] rule is satisfied and there is no need for further workup (including no need to obtain a d-dimer) as the post-test probability of pulmonary embolism is <2%.                      Mnemonic is HAD CLOTS   H - hormone use (exogenous estrogen)      No. A - age > 50                                                 No. D - DVT/PE history                                      No.   C - coughing blood (hemoptysis)                 No. L - leg swelling, unilateral                             No. O - O2 Sat on Room Air < 95%                  No. T - tachycardia (HR ? 100)                         No. S - surgery or trauma, recent  No.   Based on my evaluation of the patient, including application of this decision instrument, further testing to evaluate for pulmonary embolism is not indicated at this time.    Workup very reassuring.  Patient resting comfortably.  Suggested to her that she may wish to try a nasal steroid over-the-counter such as Nasaicort.  She is agreeable with this  Return precautions and treatment recommendations and follow-up discussed with the patient who is agreeable with the plan.      FINAL CLINICAL IMPRESSION(S) / ED DIAGNOSES   Final diagnoses:  Post-viral cough syndrome     Rx / DC Orders   ED Discharge Orders     None        Note:  This document was prepared using Dragon voice recognition software and may include unintentional dictation errors.    Delman Kitten, MD 01/23/22 2153

## 2022-01-23 NOTE — Discharge Instructions (Addendum)
Please follow-up closely with your primary care doctor.  I do recommend you continue your nasal antihistamine spray and you may wish to pick up a nasal steroid spray such as "Nasacort" at a local pharmacy.  Please return to the ER if you start experiencing chest pain, severe shortness of breath, wheezing, fever, or other new concerns arise.

## 2022-01-23 NOTE — ED Notes (Signed)
Pt placed in room 1 pt had to use bathroom and was not placed on monitor.   Mallie Mussel RN notified

## 2022-02-23 ENCOUNTER — Other Ambulatory Visit: Payer: Self-pay | Admitting: Obstetrics and Gynecology

## 2022-02-23 ENCOUNTER — Telehealth: Payer: Self-pay

## 2022-02-23 DIAGNOSIS — D252 Subserosal leiomyoma of uterus: Secondary | ICD-10-CM

## 2022-02-23 DIAGNOSIS — N923 Ovulation bleeding: Secondary | ICD-10-CM

## 2022-02-23 NOTE — Telephone Encounter (Signed)
Ultrasound order from last year has expired.  New one has been placed.

## 2022-02-23 NOTE — Telephone Encounter (Signed)
Pt calling; has scheduled annual c ASC; never had u/s that was ordered for her last year and the order has probably times out; would like another u/s ordered to check on fibroid.  [phone]

## 2022-02-24 ENCOUNTER — Ambulatory Visit
Admission: RE | Admit: 2022-02-24 | Discharge: 2022-02-24 | Disposition: A | Discharge status: Home / Self Care | Payer: Managed Care, Other (non HMO) | Source: Ambulatory Visit | Attending: Obstetrics and Gynecology | Admitting: Obstetrics and Gynecology

## 2022-02-24 DIAGNOSIS — D252 Subserosal leiomyoma of uterus: Secondary | ICD-10-CM

## 2022-02-24 DIAGNOSIS — N923 Ovulation bleeding: Secondary | ICD-10-CM | POA: Insufficient documentation

## 2022-02-24 NOTE — Telephone Encounter (Signed)
Patient is scheduled for 1/31. Patient seen  at 3:15 at [facility].

## 2022-03-03 ENCOUNTER — Ambulatory Visit (INDEPENDENT_AMBULATORY_CARE_PROVIDER_SITE_OTHER): Payer: Managed Care, Other (non HMO) | Admitting: Obstetrics and Gynecology

## 2022-03-03 ENCOUNTER — Encounter: Payer: Self-pay | Admitting: Obstetrics and Gynecology

## 2022-03-03 ENCOUNTER — Other Ambulatory Visit (HOSPITAL_COMMUNITY)
Admission: RE | Admit: 2022-03-03 | Discharge: 2022-03-03 | Disposition: A | Discharge status: Home / Self Care | Payer: Managed Care, Other (non HMO) | Source: Ambulatory Visit | Attending: Obstetrics and Gynecology | Admitting: Obstetrics and Gynecology

## 2022-03-03 VITALS — BP 123/83 | HR 82 | Resp 16 | Ht 67.0 in | Wt 159.5 lb

## 2022-03-03 DIAGNOSIS — Z113 Encounter for screening for infections with a predominantly sexual mode of transmission: Secondary | ICD-10-CM | POA: Insufficient documentation

## 2022-03-03 NOTE — Progress Notes (Signed)
    GYNECOLOGY PROGRESS NOTE  Subjective:    Patient ID: Cynthia Mcintosh, female    DOB: 12-Oct-1985, 37 y.o.   MRN: [id number]  HPI  Patient is a 37 y.o. G0P0000 female who presents for STD screening.  Gets screening routinely ~every 6 months.  Not currently sexually active. Also notes a work incident involving a rusty blade. 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 Desires repeat testing for that as well.   The following portions of the patient's history were reviewed and updated as appropriate: allergies, current medications, past family history, past medical history, past social history, past surgical history, and problem list.  Review of Systems Pertinent items noted in HPI and remainder of comprehensive ROS otherwise negative.   Objective:   Blood pressure 123/83, pulse 82, resp. rate 16, height '5\' 7"'$  (1.702 m), weight 159 lb 8 oz (72.3 kg). Body mass index is 24.98 kg/m. General appearance: cooperative and no distress Pelvic: patient allowed to self-swab specimen.   Assessment:   1. Screening for STD (sexually transmitted disease)      Plan:   1. Screening for STD (sexually transmitted disease) - Screening performed today, including HIV and HSV at patient request.   Return to clinic for any scheduled appointments or for any gynecologic concerns as needed.     Rubie Maid, MD [facility] at [facility]

## 2022-03-04 LAB — HSV 1 AND 2 AB, IGG
HSV 1 Glycoprotein G Ab, IgG: <0.91 index (ref 0.00–0.90)
HSV 2 IgG, Type Spec: <0.91 index (ref 0.00–0.90)

## 2022-03-04 LAB — RPR: RPR Ser Ql: NONREACTIVE

## 2022-03-04 LAB — HEPATITIS C ANTIBODY: Hep C Virus Ab: NONREACTIVE

## 2022-03-04 LAB — HIV ANTIBODY (ROUTINE TESTING W REFLEX): HIV Screen 4th Generation wRfx: NONREACTIVE

## 2022-03-04 LAB — HEPATITIS B SURFACE ANTIGEN: Hepatitis B Surface Ag: NEGATIVE

## 2022-03-05 LAB — CERVICOVAGINAL ANCILLARY ONLY
Chlamydia: NEGATIVE
Comment: NEGATIVE
Comment: NEGATIVE
Comment: NORMAL
Neisseria Gonorrhea: NEGATIVE
Trichomonas: NEGATIVE

## 2022-03-08 IMAGING — CR DG CHEST 2V
2 series · 2 of 2 positions shown · non-contrast
Comparison: None.

CLINICAL DATA: Chest pain, pressure

EXAM:
CHEST - 2 VIEW

[chest pa]
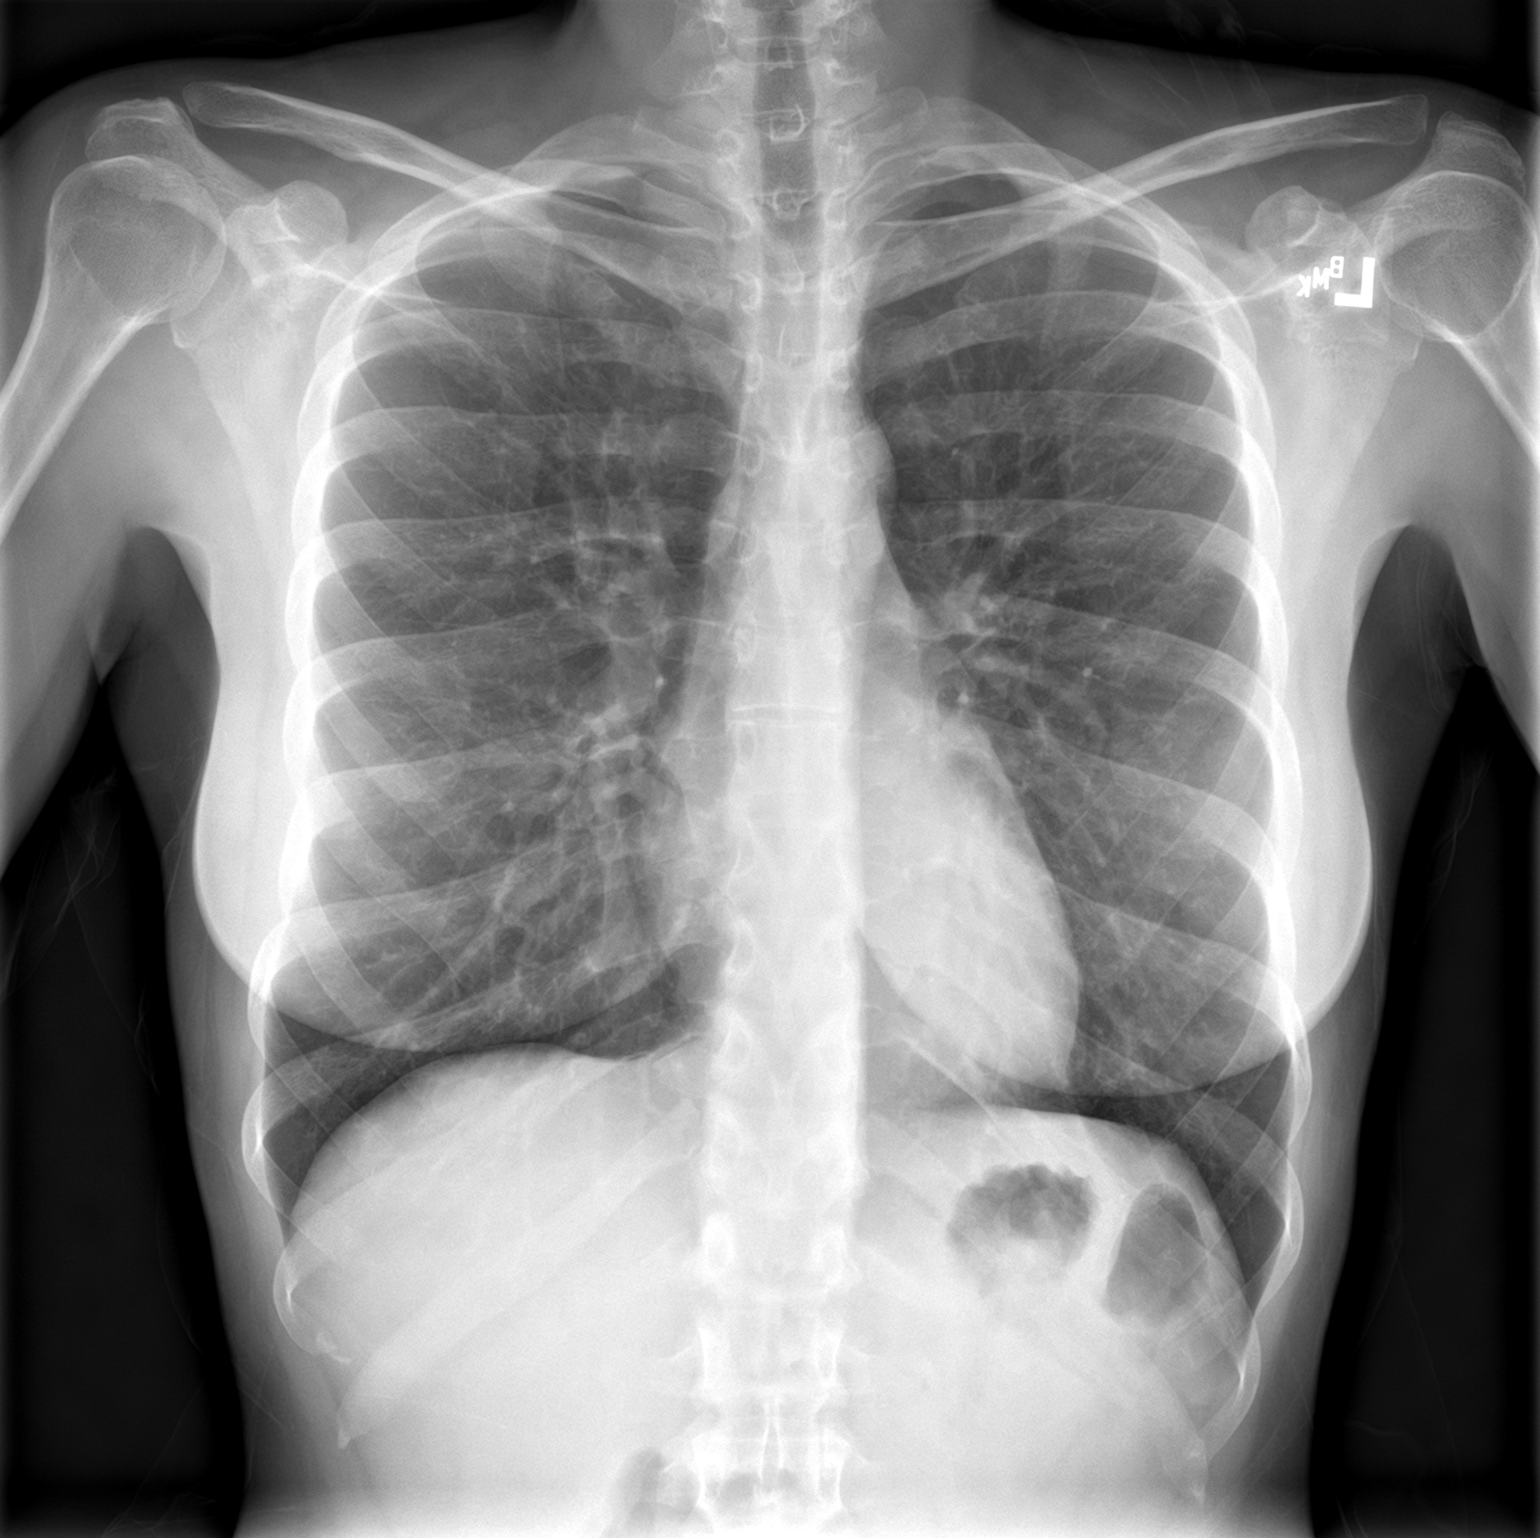

[chest lat]
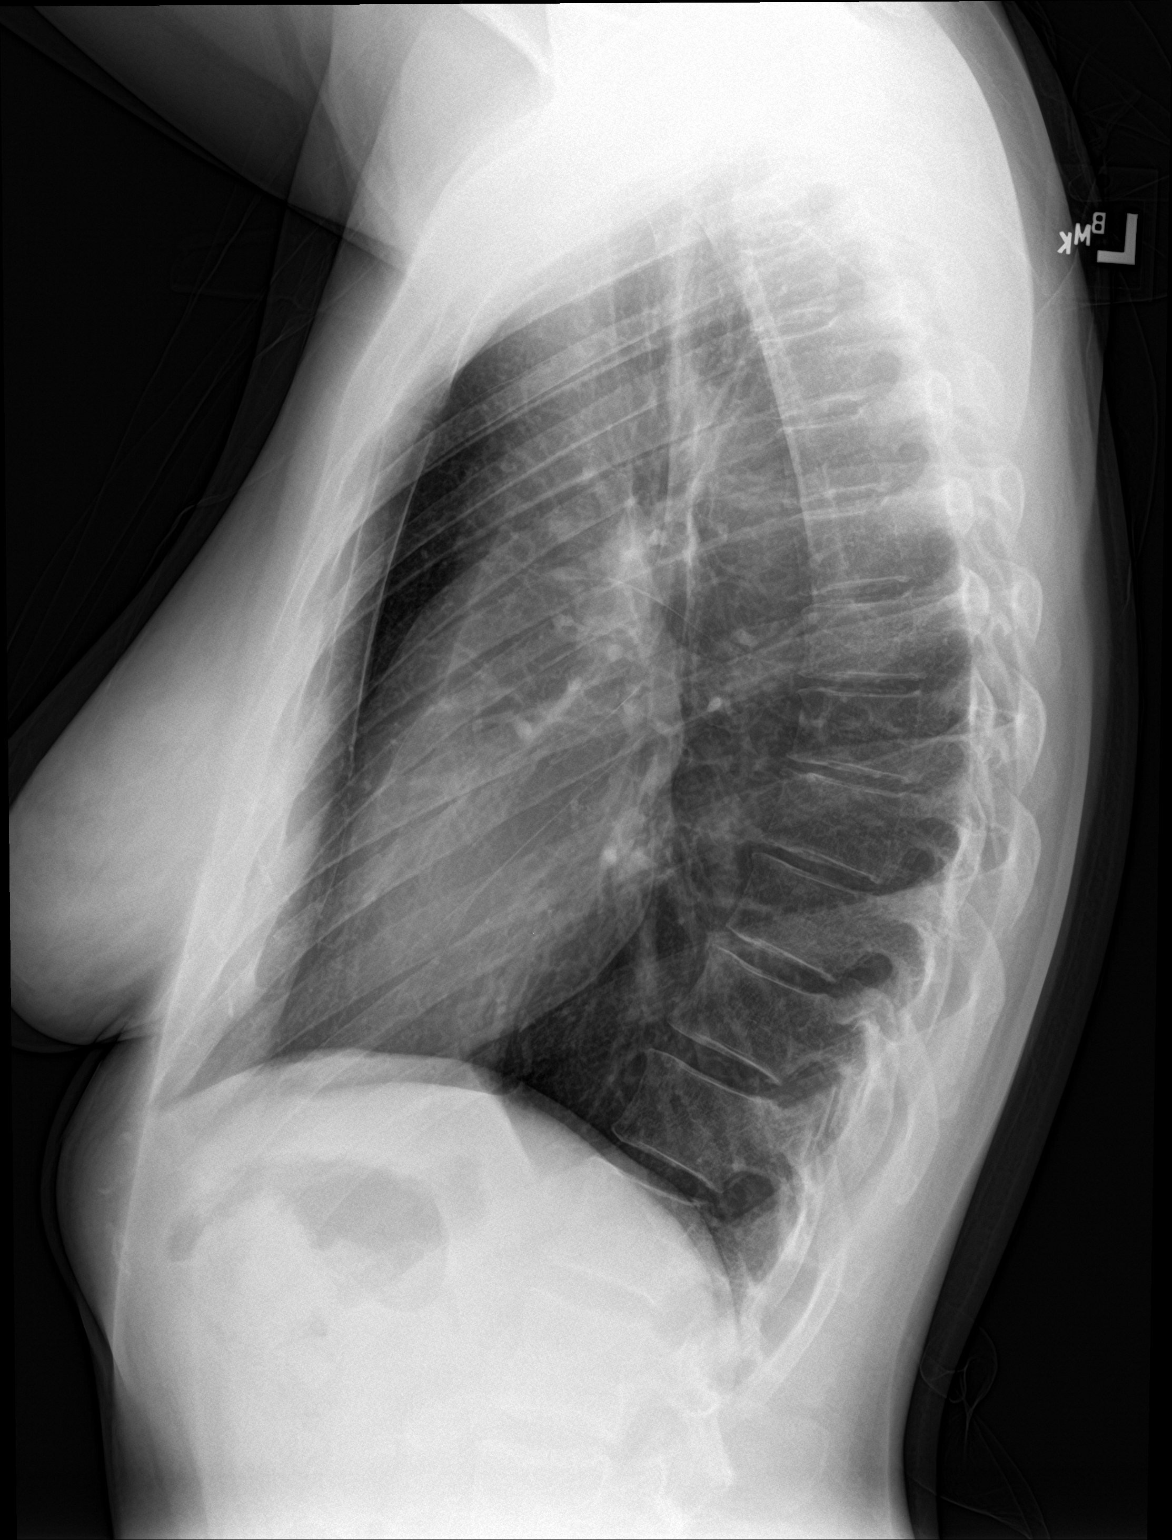

[2 of 2 positions shown; findings below may reference images not displayed]

FINDINGS: No consolidation, features of edema, pneumothorax, or effusion.
Pulmonary vascularity is normally distributed. The cardiomediastinal
contours are unremarkable. No acute osseous or soft tissue
abnormality.
IMPRESSION: No acute cardiopulmonary abnormality.

## 2022-04-19 ENCOUNTER — Other Ambulatory Visit: Payer: Self-pay

## 2022-04-19 ENCOUNTER — Emergency Department
Admission: EM | Admit: 2022-04-19 | Discharge: 2022-04-19 | Disposition: A | Discharge status: Home / Self Care | Payer: Managed Care, Other (non HMO) | Attending: Emergency Medicine | Admitting: Emergency Medicine

## 2022-04-19 DIAGNOSIS — R1084 Generalized abdominal pain: Secondary | ICD-10-CM | POA: Diagnosis present

## 2022-04-19 DIAGNOSIS — R1013 Epigastric pain: Secondary | ICD-10-CM | POA: Diagnosis not present

## 2022-04-19 LAB — CBC
HCT: 39.7 % (ref 36.0–46.0)
Hemoglobin: 13.1 g/dL (ref 12.0–15.0)
MCH: 31.6 pg (ref 26.0–34.0)
MCHC: 33 g/dL (ref 30.0–36.0)
MCV: 95.9 fL (ref 80.0–100.0)
Platelets: 226 10*3/uL (ref 150–400)
RBC: 4.14 MIL/uL (ref 3.87–5.11)
RDW: 12.4 % (ref 11.5–15.5)
WBC: 6.9 10*3/uL (ref 4.0–10.5)
nRBC: 0 % (ref 0.0–0.2)

## 2022-04-19 LAB — COMPREHENSIVE METABOLIC PANEL
ALT: 16 U/L (ref 0–44)
AST: 23 U/L (ref 15–41)
Albumin: 4 g/dL (ref 3.5–5.0)
Alkaline Phosphatase: 48 U/L (ref 38–126)
Anion gap: 11 (ref 5–15)
BUN: 9 mg/dL (ref 6–20)
CO2: 25 mmol/L (ref 22–32)
Calcium: 9.7 mg/dL (ref 8.9–10.3)
Chloride: 102 mmol/L (ref 98–111)
Creatinine, Ser: 0.8 mg/dL (ref 0.44–1.00)
GFR, Estimated: >60 mL/min (ref >60)
Glucose, Bld: 95 mg/dL (ref 70–99)
Potassium: 3.9 mmol/L (ref 3.5–5.1)
Sodium: 138 mmol/L (ref 135–145)
Total Bilirubin: 0.6 mg/dL (ref 0.3–1.2)
Total Protein: 7.7 g/dL (ref 6.5–8.1)

## 2022-04-19 LAB — URINALYSIS, ROUTINE W REFLEX MICROSCOPIC
Bilirubin Urine: NEGATIVE
Glucose, UA: NEGATIVE mg/dL
Hgb urine dipstick: NEGATIVE
Ketones, ur: NEGATIVE mg/dL
Leukocytes,Ua: NEGATIVE
Nitrite: NEGATIVE
Protein, ur: NEGATIVE mg/dL
Specific Gravity, Urine: 1.004 — ABNORMAL LOW (ref 1.005–1.030)
pH: 8 (ref 5.0–8.0)

## 2022-04-19 LAB — LIPASE, BLOOD: Lipase: 32 U/L (ref 11–51)

## 2022-04-19 MED ORDER — ONDANSETRON 4 MG PO TBDP
4.0000 mg | ORAL_TABLET | Freq: Once | ORAL | Status: AC
  Administered 2022-04-19: 4 mg via ORAL
  Filled 2022-04-19: qty 1

## 2022-04-19 MED ORDER — SIMETHICONE 80 MG PO CHEW
40.0000 mg | CHEWABLE_TABLET | Freq: Once | ORAL | Status: AC
  Administered 2022-04-19: 40 mg via ORAL
  Filled 2022-04-19: qty 1

## 2022-04-19 MED ORDER — FAMOTIDINE 20 MG PO TABS
20.0000 mg | ORAL_TABLET | Freq: Once | ORAL | Status: AC
  Administered 2022-04-19: 20 mg via ORAL
  Filled 2022-04-19: qty 1

## 2022-04-19 NOTE — ED Triage Notes (Signed)
Pt reports generalized abd pain for the past few days. +nausea. States sensitive to gluten and may have celiac.  States currently going to GI

## 2022-04-19 NOTE — ED Provider Notes (Signed)
[facility] Provider Note    Event Date/Time   First MD Initiated Contact with Patient 04/19/22 1112     (approximate)   History   Abdominal Pain   HPI  Cynthia Mcintosh is a 37 y.o. female experiencing nausea increased gas feeling for the last few days since at least Friday.  Reports intermittent crampy discomfort.  Today she was at work when she felt crampy, when it occurred she felt lightheaded.  She reports still eating and drinking.  Patient has a follow-up with GI in May she reports has been struggling with concerns that she has celiac disease now for several months.  She has a pending GI appointment, but has noticed when she changes her diet sometimes she will get gas cramps or loose stool.  Mild nausea but she reports that is not uncommon, no fevers or chills.  Had a normal bowel movement this morning but increased gas for last couple days.  She does relate though that she tried a new coconut based yogurt, cauliflower, hummus, and chickpeas over the weekend and feels that might be causing some of it.  Also following with her primary care, has an abdominal MRI scheduled to follow-up on a liver lesion it has been chronic and followed by ultrasound     Physical Exam   Triage Vital Signs: ED Triage Vitals  Enc Vitals Group     BP 04/19/22 1038 118/85     Pulse Rate 04/19/22 1038 89     Resp 04/19/22 1038 16     Temp 04/19/22 1036 97.8 F (36.6 C)     Temp src --      SpO2 04/19/22 1038 100 %     Weight 04/19/22 1038 154 lb (69.9 kg)     Height 04/19/22 1038 5\' 7"  (1.702 m)     Head Circumference --      Peak Flow --      Pain Score 04/19/22 1038 8     Pain Loc --      Pain Edu? --      Excl. in [place]? --     Most recent vital signs: Vitals:   04/19/22 1036 04/19/22 1038  BP:  118/85  Pulse:  89  Resp:  16  Temp: 97.8 F (36.6 C)   SpO2:  100%     General: Awake, no distress.  Sitting in chair conversant very pleasant. CV:  Good  peripheral perfusion.  Resp:  Normal effort.  Abd:  No distention.  Soft reports mild tenderness to palpation throughout but no focal tenderness.  Negative for focal pain in the right upper quadrant.  No pain at McBurney's point noted.  No rebound or guarding.  No peritonitis.  Negative Rovsing  other:     ED Results / Procedures / Treatments   Labs (all labs ordered are listed, but only abnormal results are displayed) Labs Reviewed  URINALYSIS, ROUTINE W REFLEX MICROSCOPIC - Abnormal; Notable for the following components:      Result Value   Color, Urine STRAW (*)    APPearance CLEAR (*)    Specific Gravity, Urine 1.004 (*)    Bacteria, UA RARE (*)    All other components within normal limits  LIPASE, BLOOD  COMPREHENSIVE METABOLIC PANEL  CBC  POC URINE PREG, ED     EKG     RADIOLOGY  I discussed with the patient the risks and benefits of abdominal CT scan. The present time there is no clear  indication that the patient requires CT, the patient does have an abdominal complaint but exam does not suggest acute surgical abdomen and my suspicion for intra-abdominal infection including appendicitis, cholecystitis, ischemia, perforation, pancreatitis, diverticulitis or other acute major intra-abdominal process is quite low.  Recent ultrasound at [facility] shows no gallstones.  After discussing the risks and benefits including benefits of additional evaluation for diagnoses, ruling out infection/appendicitis/etc, but also discussing the risks including low, but not 0 risk of inducing cancers due to radiation and potential risks of contrast the patient indicated via our shared medical decision-making that she would not do a CT scan. Rather if the patient does have worsening symptoms, develops a high fever, develops pain or persistent discomfort in the right upper quadrant or right lower quadrant, or other new concerns arise they will come back to emergency room right away. As the  patient's clinician I think this is a very reasonable decision having discussed general risks and benefits of CT, and my clinical suspicion that CT would be of benefit at this time is very low.    PROCEDURES:  Critical Care performed: No  Procedures   MEDICATIONS ORDERED IN ED: Medications  ondansetron (ZOFRAN-ODT) disintegrating tablet 4 mg (4 mg Oral Given 04/19/22 1157)  simethicone (MYLICON) chewable tablet 40 mg (40 mg Oral Given 04/19/22 1216)  famotidine (PEPCID) tablet 20 mg (20 mg Oral Given 04/19/22 1157)     IMPRESSION / MDM / ASSESSMENT AND PLAN / ED COURSE  I reviewed the triage vital signs and the nursing notes.                              Differential diagnosis includes, but is not limited to, gaseous distention, inflammatory or irritable bowel, dyspepsia, etc.  Clinical exam not consistent with appendicitis or acute intra-abdominal infection perforation or peritonitis.  Doubt acute abdominal surgical or infectious emergency.  No clinical signs or symptoms of ischemia.  No associated cardiopulmonary symptoms  Suspect based on the diet change and when she is eating with increased discomfort and gas this is most likely related to irritable bowel or some type of celiac, FODMAP, etc.  Patient's presentation is most consistent with acute complicated illness / injury requiring diagnostic workup.   Patient advised she actually has an appointment tomorrow with a dietitian, has upcoming appoint with GI scheduled for May.   Urinalysis noted rare bacteria but no evidence to suggest acute infection.  This in combination without the patient having acute urinary tract symptoms clinically doubt UTI  CBC and comprehensive metabolic panel are normal   ----------------------------------------- 2:25 PM on 04/19/2022 ----------------------------------------- Patient reports she feels better.  Feeling improved resting comfortably no distress.  Awaiting urine pregnancy test, patient  reports she is celibate not sexually active at all.   Discussed with patient careful return precautions which she is agreeable with, plan for continued follow-up with primary GI and nutritionist.     U preg ***  FINAL CLINICAL IMPRESSION(S) / ED DIAGNOSES   Final diagnoses:  Dyspepsia     Rx / DC Orders   ED Discharge Orders     None        Note:  This document was prepared using Dragon voice recognition software and may include unintentional dictation errors.

## 2022-04-19 NOTE — Discharge Instructions (Signed)
Please continue to follow-up with your primary care as well as gastroenterology.  Please return to the emergency room right away if you are to develop a fever, severe nausea, your pain becomes severe or worsens, you are unable to keep food down, begin vomiting any dark or bloody fluid, you develop any dark or bloody stools, feel dehydrated, or other new concerns or symptoms arise.

## 2022-04-19 NOTE — ED Notes (Signed)
Urine sample previously collect by other staff member. John Therapist, sports at bedside.

## 2022-04-26 ENCOUNTER — Telehealth: Payer: Self-pay | Admitting: Gastroenterology

## 2022-04-26 NOTE — Telephone Encounter (Signed)
Called patient back and left a detail message informing patient that 06/08/2022 was our first available appointment. Informed her that I will add the appointment to the wait list and if anything comes up sooner we would call her. Patient has never been seen at our office before.

## 2022-04-26 NOTE — Telephone Encounter (Signed)
Patient calling stating that she has an upcoming appointment scheduled with Dr. Marius Ditch. She states she went to the ER on 04/19/2022 because she could not handle the pain. She is wondering if Dr. Marius Ditch could possibly fit her in for a sooner appointment. Patient states if so then go ahead and schedule something and send her a mychart message with the new appointment or leave her a voice mail. She is going into work at this time and will not be able to answer her phone.

## 2022-04-27 ENCOUNTER — Ambulatory Visit: Payer: Managed Care, Other (non HMO) | Admitting: Family

## 2022-04-27 VITALS — BP 120/72 | HR 104 | Ht 68.0 in | Wt 153.0 lb

## 2022-04-27 DIAGNOSIS — R5383 Other fatigue: Secondary | ICD-10-CM

## 2022-04-27 DIAGNOSIS — E538 Deficiency of other specified B group vitamins: Secondary | ICD-10-CM

## 2022-04-27 DIAGNOSIS — E559 Vitamin D deficiency, unspecified: Secondary | ICD-10-CM | POA: Diagnosis not present

## 2022-04-27 DIAGNOSIS — R7303 Prediabetes: Secondary | ICD-10-CM

## 2022-04-27 DIAGNOSIS — E611 Iron deficiency: Secondary | ICD-10-CM

## 2022-04-27 DIAGNOSIS — E782 Mixed hyperlipidemia: Secondary | ICD-10-CM | POA: Diagnosis not present

## 2022-04-27 DIAGNOSIS — R1084 Generalized abdominal pain: Secondary | ICD-10-CM

## 2022-04-27 MED ORDER — RIFAXIMIN 550 MG PO TABS: 550.0000 mg | ORAL_TABLET | Freq: Two times a day (BID) | ORAL | 0 refills | Status: AC

## 2022-04-27 NOTE — Progress Notes (Unsigned)
Established Patient Office Visit  Subjective:  Patient ID: Cynthia Mcintosh, female    DOB: 05-10-1985  Age: 37 y.o. MRN: [id number]  Chief Complaint  Patient presents with   Follow-up    Neck tightness, acid reflux/stomach pain    Patient here today with concerns about abdominal pain.  She says that around 2 weeks ago, made vegan alfredo pasta, made with cauliflower and chickpea.  Ended up in the hospital, had severe abdominal pain for a few days.  Possible that COVID activated the celiac gene? She has been cautious of gluten for about 2 years, but she says that it is possible that there could have been some cross contamination.      Gastroesophageal Reflux She complains of abdominal pain, heartburn and nausea. The symptoms are aggravated by lying down, certain foods and stress. There are no known risk factors. She has tried an antacid, a diet change, head elevation, a PPI and a histamine-2 antagonist for the symptoms. The treatment provided no relief.  Abdominal Pain This is a chronic problem. The current episode started more than 1 month ago. The onset quality is undetermined. The problem occurs 2 to 4 times per day. The problem has been waxing and waning. The pain is located in the generalized abdominal region. Associated symptoms include constipation, nausea and vomiting. The pain is aggravated by eating and certain positions. The pain is relieved by Nothing. She has tried H2 blockers, proton pump inhibitors and antacids for the symptoms. The treatment provided no relief. Her past medical history is significant for GERD.    No other concerns at this time.   Past Medical History:  Diagnosis Date   Anxiety    Asthma 05/06/2019   COVID     Past Surgical History:  Procedure Laterality Date   TONSILECTOMY/ADENOIDECTOMY WITH MYRINGOTOMY      Social History   Socioeconomic History   Marital status: Single    Spouse name: Not on file   Number of children: Not on file   Years  of education: Not on file   Highest education level: Not on file  Occupational History   Not on file  Tobacco Use   Smoking status: Never   Smokeless tobacco: Never  Vaping Use   Vaping Use: Never used  Substance and Sexual Activity   Alcohol use: Not Currently   Drug use: Not Currently   Sexual activity: Yes    Birth control/protection: Pill  Other Topics Concern   Not on file  Social History Narrative   Not on file   Social Determinants of Health   Financial Resource Strain: Not on file  Food Insecurity: Not on file  Transportation Needs: Not on file  Physical Activity: Not on file  Stress: Not on file  Social Connections: Not on file  Intimate Partner Violence: Not on file    Family History  Problem Relation Age of Onset   Hypertension Mother    Hypercholesterolemia Mother    Hypertension Father    Thyroid disease Father    COPD Maternal Grandmother    Diabetes Maternal Grandfather    Hypertension Maternal Grandfather    Stroke Maternal Grandfather     No Known Allergies  Review of Systems  Gastrointestinal:  Positive for abdominal pain, constipation, heartburn, nausea and vomiting.  All other systems reviewed and are negative.      Objective:   BP 120/72   Pulse (!) 104   Ht 5\' 8"  (1.727 m)   Wt 153 lb (  69.4 kg)   LMP 04/06/2022   SpO2 98%   BMI 23.26 kg/m   Vitals:   04/27/22 1111  BP: 120/72  Pulse: (!) 104  Height: 5\' 8"  (1.727 m)  Weight: 153 lb (69.4 kg)  SpO2: 98%  BMI (Calculated): 23.27    Physical Exam Vitals and nursing note reviewed.  Constitutional:      Appearance: Normal appearance. She is normal weight.  HENT:     Head: Normocephalic.  Eyes:     Pupils: Pupils are equal, round, and reactive to light.  Cardiovascular:     Rate and Rhythm: Normal rate.  Pulmonary:     Effort: Pulmonary effort is normal.  Abdominal:     General: Bowel sounds are normal.     Palpations: Abdomen is soft.  Neurological:     Mental  Status: She is alert.    No results found for any visits on 04/27/22.  Recent Results (from the past 2160 hour(s))  Cervicovaginal ancillary only     Status: None   Collection Time: 03/03/22  2:14 PM  Result Value Ref Range   Neisseria Gonorrhea Negative    Chlamydia Negative    Trichomonas Negative    Comment Normal Reference Range Trichomonas - Negative    Comment Normal Reference Ranger Chlamydia - Negative    Comment      Normal Reference Range Neisseria Gonorrhea - Negative  Hepatitis B surface antigen     Status: None   Collection Time: 03/03/22  2:40 PM  Result Value Ref Range   Hepatitis B Surface Ag Negative Negative  Hepatitis C antibody     Status: None   Collection Time: 03/03/22  2:40 PM  Result Value Ref Range   Hep C Virus Ab Non Reactive Non Reactive    Comment: HCV antibody alone does not differentiate between previously resolved infection and active infection. Equivocal and Reactive HCV antibody results should be followed up with an HCV RNA test to support the diagnosis of active HCV infection.   HIV Antibody (routine testing w rflx)     Status: None   Collection Time: 03/03/22  2:40 PM  Result Value Ref Range   HIV Screen 4th Generation wRfx Non Reactive Non Reactive    Comment: HIV Negative HIV-1/HIV-2 antibodies and HIV-1 p24 antigen were NOT detected. There is no laboratory evidence of HIV infection.   RPR     Status: None   Collection Time: 03/03/22  2:40 PM  Result Value Ref Range   RPR Ser Ql Non Reactive Non Reactive  HSV 1 and 2 Ab, IgG     Status: None   Collection Time: 03/03/22  2:40 PM  Result Value Ref Range   HSV 1 Glycoprotein G Ab, IgG <0.91 0.00 - 0.90 index    Comment:                                  Negative        <0.91                                  Equivocal 0.91 - 1.09                                  Positive        >  1.09  Note: Negative indicates no antibodies detected to  HSV-1. Equivocal may suggest early infection.   If  clinically appropriate, retest at later date. Positive  indicates antibodies detected to HSV-1.    HSV 2 IgG, Type Spec <0.91 0.00 - 0.90 index    Comment:                                  Negative        <0.91                                  Equivocal 0.91 - 1.09                                  Positive        >1.09  HSV-2 Antibody Interpretation: Current guidelines and  recommendations do not recommend routine screening  for HSV-2 in asymptomatic individuals, including  those that are pregnant. A negative antibody result  indicates no detectable antibodies to HSV-2 were  found. If recent exposure is suspected, retest in 4  to 6 weeks. Equivocal samples should be retested in  4 to 6 weeks. A positive result indicates the  presence of detectable IgG antibody to HSV-2.  FALSE POSITIVE RESULTS MAY OCCUR. Repeat testing, or  testing by a different method, may be indicated in  some settings (e.g. patients with low likelihood of  HSV infection). If clinically appropriate, retest 4  to 6 weeks later. HSV-2 IgG antibody testing results  should be clinically correlated.   Lipase, blood     Status: None   Collection Time: 04/19/22 10:41 AM  Result Value Ref Range   Lipase 32 11 - 51 U/L    Comment: Performed at [facility], [address] [place], [place] [address]  Comprehensive metabolic panel     Status: None   Collection Time: 04/19/22 10:41 AM  Result Value Ref Range   Sodium 138 135 - 145 mmol/L   Potassium 3.9 3.5 - 5.1 mmol/L   Chloride 102 98 - 111 mmol/L   CO2 25 22 - 32 mmol/L   Glucose, Bld 95 70 - 99 mg/dL    Comment: Glucose reference range applies only to samples taken after fasting for at least 8 hours.   BUN 9 6 - 20 mg/dL   Creatinine, Ser 0.80 0.44 - 1.00 mg/dL   Calcium 9.7 8.9 - 10.3 mg/dL   Total Protein 7.7 6.5 - 8.1 g/dL   Albumin 4.0 3.5 - 5.0 g/dL   AST 23 15 - 41 U/L   ALT 16 0 - 44 U/L   Alkaline Phosphatase 48 38 - 126 U/L   Total  Bilirubin 0.6 0.3 - 1.2 mg/dL   GFR, Estimated >60 >60 mL/min    Comment: (NOTE) Calculated using the CKD-EPI Creatinine Equation (2021)    Anion gap 11 5 - 15    Comment: Performed at [facility], [address] [place], [place] [address]  CBC     Status: None   Collection Time: 04/19/22 10:41 AM  Result Value Ref Range   WBC 6.9 4.0 - 10.5 K/uL   RBC 4.14 3.87 - 5.11 MIL/uL   Hemoglobin 13.1 12.0 - 15.0 g/dL   HCT 39.7 36.0 - 46.0 %  MCV 95.9 80.0 - 100.0 fL   MCH 31.6 26.0 - 34.0 pg   MCHC 33.0 30.0 - 36.0 g/dL   RDW 12.4 11.5 - 15.5 %   Platelets 226 150 - 400 K/uL   nRBC 0.0 0.0 - 0.2 %    Comment: Performed at [facility], [address] [place], [place] [address]  Urinalysis, Routine w reflex microscopic -Urine, Clean Catch     Status: Abnormal   Collection Time: 04/19/22 11:50 AM  Result Value Ref Range   Color, Urine STRAW (A) YELLOW   APPearance CLEAR (A) CLEAR   Specific Gravity, Urine 1.004 (L) 1.005 - 1.030   pH 8.0 5.0 - 8.0   Glucose, UA NEGATIVE NEGATIVE mg/dL   Hgb urine dipstick NEGATIVE NEGATIVE   Bilirubin Urine NEGATIVE NEGATIVE   Ketones, ur NEGATIVE NEGATIVE mg/dL   Protein, ur NEGATIVE NEGATIVE mg/dL   Nitrite NEGATIVE NEGATIVE   Leukocytes,Ua NEGATIVE NEGATIVE   RBC / HPF 0-5 0 - 5 RBC/hpf   WBC, UA 0-5 0 - 5 WBC/hpf   Bacteria, UA RARE (A) NONE SEEN   Squamous Epithelial / HPF 0-5 0 - 5 /HPF    Comment: Performed at [facility], [address] [place], [place] [address]      Assessment & Plan:   Problem List Items Addressed This Visit     Prediabetes - Primary   Relevant Orders   CBC With Differential   CMP14+EGFR   Hemoglobin A1c   Other Visit Diagnoses     B12 deficiency due to diet       Relevant Orders   CBC With Differential   CMP14+EGFR   Vitamin B12   Mixed hyperlipidemia       Relevant Orders   Lipid panel   CBC With Differential   CMP14+EGFR   Vitamin D deficiency, unspecified        Relevant Orders   VITAMIN D 25 Hydroxy (Vit-D Deficiency, Fractures)   CBC With Differential   CMP14+EGFR   Generalized abdominal pain       Relevant Orders   CBC With Differential   CMP14+EGFR   Inflammatory Bowel Disease-IBD   Pancreatic Elastase, Fecal   Iron deficiency       Relevant Orders   CBC With Differential   CMP14+EGFR   Iron, TIBC and Ferritin Panel   Other fatigue       Relevant Orders   TSH   Zinc   Vitamin B6   Magnesium      Checking multiple labs today to see if we can determine a cause for her issues.   Return in about 2 weeks (around 05/11/2022) for F/U.   Total time spent: 40 minutes  Mechele Claude, FNP  04/27/2022

## 2022-04-29 ENCOUNTER — Encounter: Payer: Self-pay | Admitting: Family

## 2022-05-02 LAB — CMP14+EGFR
ALT: 14 IU/L (ref 0–32)
AST: 19 IU/L (ref 0–40)
Albumin/Globulin Ratio: 1.5 (ref 1.2–2.2)
Albumin: 4.5 g/dL (ref 3.9–4.9)
Alkaline Phosphatase: 58 IU/L (ref 44–121)
BUN/Creatinine Ratio: 13 (ref 9–23)
BUN: 12 mg/dL (ref 6–20)
Bilirubin Total: 0.3 mg/dL (ref 0.0–1.2)
CO2: 20 mmol/L (ref 20–29)
Calcium: 9.2 mg/dL (ref 8.7–10.2)
Chloride: 103 mmol/L (ref 96–106)
Creatinine, Ser: 0.9 mg/dL (ref 0.57–1.00)
Globulin, Total: 3 g/dL (ref 1.5–4.5)
Glucose: 88 mg/dL (ref 70–99)
Potassium: 3.8 mmol/L (ref 3.5–5.2)
Sodium: 138 mmol/L (ref 134–144)
Total Protein: 7.5 g/dL (ref 6.0–8.5)
eGFR: 85 mL/min/{1.73_m2} (ref >59)

## 2022-05-02 LAB — VITAMIN D 25 HYDROXY (VIT D DEFICIENCY, FRACTURES): Vit D, 25-Hydroxy: 28.2 ng/mL — ABNORMAL LOW (ref 30.0–100.0)

## 2022-05-02 LAB — IRON,TIBC AND FERRITIN PANEL
Ferritin: 63 ng/mL (ref 15–150)
Iron Saturation: 26 % (ref 15–55)
Iron: 95 ug/dL (ref 27–159)
Total Iron Binding Capacity: 360 ug/dL (ref 250–450)
UIBC: 265 ug/dL (ref 131–425)

## 2022-05-02 LAB — CBC WITH DIFFERENTIAL
Basophils Absolute: 0 10*3/uL (ref 0.0–0.2)
Basos: 0 %
EOS (ABSOLUTE): 0.1 10*3/uL (ref 0.0–0.4)
Eos: 1 %
Hematocrit: 39.5 % (ref 34.0–46.6)
Hemoglobin: 13 g/dL (ref 11.1–15.9)
Immature Grans (Abs): 0 10*3/uL (ref 0.0–0.1)
Immature Granulocytes: 0 %
Lymphocytes Absolute: 2.3 10*3/uL (ref 0.7–3.1)
Lymphs: 34 %
MCH: 31.5 pg (ref 26.6–33.0)
MCHC: 32.9 g/dL (ref 31.5–35.7)
MCV: 96 fL (ref 79–97)
Monocytes Absolute: 0.5 10*3/uL (ref 0.1–0.9)
Monocytes: 8 %
Neutrophils Absolute: 3.8 10*3/uL (ref 1.4–7.0)
Neutrophils: 57 %
RBC: 4.13 x10E6/uL (ref 3.77–5.28)
RDW: 12.1 % (ref 11.7–15.4)
WBC: 6.7 10*3/uL (ref 3.4–10.8)

## 2022-05-02 LAB — HEMOGLOBIN A1C
Est. average glucose Bld gHb Est-mCnc: 117 mg/dL
Hgb A1c MFr Bld: 5.7 % — ABNORMAL HIGH (ref 4.8–5.6)

## 2022-05-02 LAB — TSH: TSH: 1.58 u[IU]/mL (ref 0.450–4.500)

## 2022-05-02 LAB — LIPID PANEL
Chol/HDL Ratio: 2.1 ratio (ref 0.0–4.4)
Cholesterol, Total: 152 mg/dL (ref 100–199)
HDL: 73 mg/dL (ref >39)
LDL Chol Calc (NIH): 66 mg/dL (ref 0–99)
Triglycerides: 63 mg/dL (ref 0–149)
VLDL Cholesterol Cal: 13 mg/dL (ref 5–40)

## 2022-05-02 LAB — INFLAMMATORY BOWEL DISEASE-IBD
Atypical pANCA: <1:20 {titer}
Saccharomyces cerevisiae, IgA: <20 Units (ref 0.0–24.9)
Saccharomyces cerevisiae, IgG: 36.9 Units — ABNORMAL HIGH (ref 0.0–24.9)

## 2022-05-02 LAB — ZINC: Zinc: 80 ug/dL (ref 44–115)

## 2022-05-02 LAB — VITAMIN B6: Vitamin B6: 20.2 ug/L (ref 3.4–65.2)

## 2022-05-02 LAB — VITAMIN B12: Vitamin B-12: 926 pg/mL (ref 232–1245)

## 2022-05-02 LAB — MAGNESIUM: Magnesium: 2.1 mg/dL (ref 1.6–2.3)

## 2022-05-02 LAB — PANCREATIC ELASTASE, FECAL: Pancreatic Elastase, Fecal: 313 ug Elast./g (ref >200)

## 2022-05-11 ENCOUNTER — Ambulatory Visit: Payer: Managed Care, Other (non HMO) | Admitting: Family

## 2022-05-11 VITALS — BP 118/68 | HR 86 | Ht 68.0 in | Wt 148.6 lb

## 2022-05-11 DIAGNOSIS — K9041 Non-celiac gluten sensitivity: Secondary | ICD-10-CM

## 2022-05-11 DIAGNOSIS — K22710 Barrett's esophagus with low grade dysplasia: Secondary | ICD-10-CM | POA: Diagnosis not present

## 2022-05-11 DIAGNOSIS — R1084 Generalized abdominal pain: Secondary | ICD-10-CM | POA: Diagnosis not present

## 2022-05-13 LAB — CELIAC PANEL 10
Antigliadin Abs, IgA: 3 units (ref 0–19)
Endomysial IgA: NEGATIVE
Gliadin IgG: 2 units (ref 0–19)
IgA/Immunoglobulin A, Serum: 169 mg/dL (ref 87–352)
Tissue Transglut Ab: <2 U/mL (ref 0–5)
Transglutaminase IgA: <2 U/mL (ref 0–3)

## 2022-05-15 ENCOUNTER — Encounter: Payer: Self-pay | Admitting: Family

## 2022-05-15 NOTE — Progress Notes (Signed)
Established Patient Office Visit  Subjective:  Patient ID: Cynthia Mcintosh, female    DOB: Sep 27, 1985  Age: 37 y.o. MRN: [id number]  Chief Complaint  Patient presents with   Follow-up    2 week follow up with lab results    Patient here today for her 2 week follow up.  Her labwork did show POSSIBLE IBD, but I have reiterated to her that this does not necessarily mean she has UC or Crohn's.   She has been having continued issues with her bowels, says that her abdominal pain has continued.  She is looking into alternatives and functional medicine to see if this can help.   No other concerns at this time.   Past Medical History:  Diagnosis Date   Anxiety    Asthma 05/06/2019   COVID     Past Surgical History:  Procedure Laterality Date   TONSILECTOMY/ADENOIDECTOMY WITH MYRINGOTOMY      Social History   Socioeconomic History   Marital status: Single    Spouse name: Not on file   Number of children: Not on file   Years of education: Not on file   Highest education level: Not on file  Occupational History   Not on file  Tobacco Use   Smoking status: Never   Smokeless tobacco: Never  Vaping Use   Vaping Use: Never used  Substance and Sexual Activity   Alcohol use: Not Currently   Drug use: Not Currently   Sexual activity: Yes    Birth control/protection: Pill  Other Topics Concern   Not on file  Social History Narrative   Not on file   Social Determinants of Health   Financial Resource Strain: Not on file  Food Insecurity: Not on file  Transportation Needs: Not on file  Physical Activity: Not on file  Stress: Not on file  Social Connections: Not on file  Intimate Partner Violence: Not on file    Family History  Problem Relation Age of Onset   Hypertension Mother    Hypercholesterolemia Mother    Hypertension Father    Thyroid disease Father    COPD Maternal Grandmother    Diabetes Maternal Grandfather    Hypertension Maternal Grandfather    Stroke  Maternal Grandfather     No Known Allergies  Review of Systems  Gastrointestinal:  Positive for abdominal pain, constipation, diarrhea, heartburn and nausea.  All other systems reviewed and are negative.      Objective:   BP 118/68   Pulse 86   Ht 5\' 8"  (1.727 m)   Wt 148 lb 9.6 oz (67.4 kg)   LMP 04/06/2022   SpO2 98%   BMI 22.59 kg/m   Vitals:   05/11/22 1312  BP: 118/68  Pulse: 86  Height: 5\' 8"  (1.727 m)  Weight: 148 lb 9.6 oz (67.4 kg)  SpO2: 98%  BMI (Calculated): 22.6    Physical Exam Vitals and nursing note reviewed.  Constitutional:      Appearance: Normal appearance. She is normal weight.  HENT:     Head: Normocephalic.  Eyes:     Pupils: Pupils are equal, round, and reactive to light.  Cardiovascular:     Rate and Rhythm: Normal rate.  Pulmonary:     Effort: Pulmonary effort is normal.  Neurological:     Mental Status: She is alert.  Psychiatric:        Mood and Affect: Mood normal.        Behavior: Behavior normal.  Thought Content: Thought content normal.        Judgment: Judgment normal.      Results for orders placed or performed in visit on 05/11/22  Celiac panel 10  Result Value Ref Range   Antigliadin Abs, IgA 3 0 - 19 units   Gliadin IgG 2 0 - 19 units   Transglutaminase IgA <2 0 - 3 U/mL   Tissue Transglut Ab <2 0 - 5 U/mL   Endomysial IgA Negative Negative   IgA/Immunoglobulin A, Serum 169 87 - 352 mg/dL    Recent Results (from the past 2160 hour(s))  Cervicovaginal ancillary only     Status: None   Collection Time: 03/03/22  2:14 PM  Result Value Ref Range   Neisseria Gonorrhea Negative    Chlamydia Negative    Trichomonas Negative    Comment Normal Reference Range Trichomonas - Negative    Comment Normal Reference Ranger Chlamydia - Negative    Comment      Normal Reference Range Neisseria Gonorrhea - Negative  Hepatitis B surface antigen     Status: None   Collection Time: 03/03/22  2:40 PM  Result Value Ref  Range   Hepatitis B Surface Ag Negative Negative  Hepatitis C antibody     Status: None   Collection Time: 03/03/22  2:40 PM  Result Value Ref Range   Hep C Virus Ab Non Reactive Non Reactive    Comment: HCV antibody alone does not differentiate between previously resolved infection and active infection. Equivocal and Reactive HCV antibody results should be followed up with an HCV RNA test to support the diagnosis of active HCV infection.   HIV Antibody (routine testing w rflx)     Status: None   Collection Time: 03/03/22  2:40 PM  Result Value Ref Range   HIV Screen 4th Generation wRfx Non Reactive Non Reactive    Comment: HIV Negative HIV-1/HIV-2 antibodies and HIV-1 p24 antigen were NOT detected. There is no laboratory evidence of HIV infection.   RPR     Status: None   Collection Time: 03/03/22  2:40 PM  Result Value Ref Range   RPR Ser Ql Non Reactive Non Reactive  HSV 1 and 2 Ab, IgG     Status: None   Collection Time: 03/03/22  2:40 PM  Result Value Ref Range   HSV 1 Glycoprotein G Ab, IgG <0.91 0.00 - 0.90 index    Comment:                                  Negative        <0.91                                  Equivocal 0.91 - 1.09                                  Positive        >1.09  Note: Negative indicates no antibodies detected to  HSV-1. Equivocal may suggest early infection.  If  clinically appropriate, retest at later date. Positive  indicates antibodies detected to HSV-1.    HSV 2 IgG, Type Spec <0.91 0.00 - 0.90 index    Comment:  Negative        <0.91                                  Equivocal 0.91 - 1.09                                  Positive        >1.09  HSV-2 Antibody Interpretation: Current guidelines and  recommendations do not recommend routine screening  for HSV-2 in asymptomatic individuals, including  those that are pregnant. A negative antibody result  indicates no detectable antibodies to HSV-2 were  found.  If recent exposure is suspected, retest in 4  to 6 weeks. Equivocal samples should be retested in  4 to 6 weeks. A positive result indicates the  presence of detectable IgG antibody to HSV-2.  FALSE POSITIVE RESULTS MAY OCCUR. Repeat testing, or  testing by a different method, may be indicated in  some settings (e.g. patients with low likelihood of  HSV infection). If clinically appropriate, retest 4  to 6 weeks later. HSV-2 IgG antibody testing results  should be clinically correlated.   Lipase, blood     Status: None   Collection Time: 04/19/22 10:41 AM  Result Value Ref Range   Lipase 32 11 - 51 U/L    Comment: Performed at [facility], [address] [place], [place] [address]  Comprehensive metabolic panel     Status: None   Collection Time: 04/19/22 10:41 AM  Result Value Ref Range   Sodium 138 135 - 145 mmol/L   Potassium 3.9 3.5 - 5.1 mmol/L   Chloride 102 98 - 111 mmol/L   CO2 25 22 - 32 mmol/L   Glucose, Bld 95 70 - 99 mg/dL    Comment: Glucose reference range applies only to samples taken after fasting for at least 8 hours.   BUN 9 6 - 20 mg/dL   Creatinine, Ser 6.04 0.44 - 1.00 mg/dL   Calcium 9.7 8.9 - 54.0 mg/dL   Total Protein 7.7 6.5 - 8.1 g/dL   Albumin 4.0 3.5 - 5.0 g/dL   AST 23 15 - 41 U/L   ALT 16 0 - 44 U/L   Alkaline Phosphatase 48 38 - 126 U/L   Total Bilirubin 0.6 0.3 - 1.2 mg/dL   GFR, Estimated >98 >11 mL/min    Comment: (NOTE) Calculated using the CKD-EPI Creatinine Equation (2021)    Anion gap 11 5 - 15    Comment: Performed at [facility], [address] [place], [place] [address]  CBC     Status: None   Collection Time: 04/19/22 10:41 AM  Result Value Ref Range   WBC 6.9 4.0 - 10.5 K/uL   RBC 4.14 3.87 - 5.11 MIL/uL   Hemoglobin 13.1 12.0 - 15.0 g/dL   HCT 29.5 62.1 - 30.8 %   MCV 95.9 80.0 - 100.0 fL   MCH 31.6 26.0 - 34.0 pg   MCHC 33.0 30.0 - 36.0 g/dL   RDW 65.7 84.6 - 96.2 %   Platelets 226 150 - 400 K/uL    nRBC 0.0 0.0 - 0.2 %    Comment: Performed at [facility], [address] [place], [place] [address]  Urinalysis, Routine w reflex microscopic -Urine, Clean Catch     Status: Abnormal   Collection Time:  04/19/22 11:50 AM  Result Value Ref Range   Color, Urine STRAW (A) YELLOW   APPearance CLEAR (A) CLEAR   Specific Gravity, Urine 1.004 (L) 1.005 - 1.030   pH 8.0 5.0 - 8.0   Glucose, UA NEGATIVE NEGATIVE mg/dL   Hgb urine dipstick NEGATIVE NEGATIVE   Bilirubin Urine NEGATIVE NEGATIVE   Ketones, ur NEGATIVE NEGATIVE mg/dL   Protein, ur NEGATIVE NEGATIVE mg/dL   Nitrite NEGATIVE NEGATIVE   Leukocytes,Ua NEGATIVE NEGATIVE   RBC / HPF 0-5 0 - 5 RBC/hpf   WBC, UA 0-5 0 - 5 WBC/hpf   Bacteria, UA RARE (A) NONE SEEN   Squamous Epithelial / HPF 0-5 0 - 5 /HPF    Comment: Performed at [facility], [address] [place], [place] [address]  Lipid panel     Status: None   Collection Time: 04/27/22 12:20 PM  Result Value Ref Range   Cholesterol, Total 152 100 - 199 mg/dL   Triglycerides 63 0 - 149 mg/dL   HDL 73 >60 mg/dL   VLDL Cholesterol Cal 13 5 - 40 mg/dL   LDL Chol Calc (NIH) 66 0 - 99 mg/dL   Chol/HDL Ratio 2.1 0.0 - 4.4 ratio    Comment:                                   T. Chol/HDL Ratio                                             Men  Women                               1/2 Avg.Risk  3.4    3.3                                   Avg.Risk  5.0    4.4                                2X Avg.Risk  9.6    7.1                                3X Avg.Risk 23.4   11.0   VITAMIN D 25 Hydroxy (Vit-D Deficiency, Fractures)     Status: Abnormal   Collection Time: 04/27/22 12:20 PM  Result Value Ref Range   Vit D, 25-Hydroxy 28.2 (L) 30.0 - 100.0 ng/mL    Comment: Vitamin D deficiency has been defined by the Institute of Medicine and an Endocrine Society practice guideline as a level of serum 25-OH vitamin D less than 20 ng/mL (1,2). The Endocrine Society went on to  further define vitamin D insufficiency as a level between 21 and 29 ng/mL (2). 1. IOM (Institute of Medicine). 2010. Dietary reference    intakes for calcium and D. [place]: The    [employer]. 2. Holick MF, Binkley [place], Bischoff-Ferrari HA, et al.    Evaluation, treatment, and prevention of vitamin D    deficiency: an Endocrine Society clinical practice  guideline. JCEM. 2011 Jul; 96(7):1911-30.   CBC With Differential     Status: None   Collection Time: 04/27/22 12:20 PM  Result Value Ref Range   WBC 6.7 3.4 - 10.8 x10E3/uL   RBC 4.13 3.77 - 5.28 x10E6/uL   Hemoglobin 13.0 11.1 - 15.9 g/dL   Hematocrit 16.1 09.6 - 46.6 %   MCV 96 79 - 97 fL   MCH 31.5 26.6 - 33.0 pg   MCHC 32.9 31.5 - 35.7 g/dL   RDW 04.5 40.9 - 81.1 %   Neutrophils 57 Not Estab. %   Lymphs 34 Not Estab. %   Monocytes 8 Not Estab. %   Eos 1 Not Estab. %   Basos 0 Not Estab. %   Neutrophils Absolute 3.8 1.4 - 7.0 x10E3/uL   Lymphocytes Absolute 2.3 0.7 - 3.1 x10E3/uL   Monocytes Absolute 0.5 0.1 - 0.9 x10E3/uL   EOS (ABSOLUTE) 0.1 0.0 - 0.4 x10E3/uL   Basophils Absolute 0.0 0.0 - 0.2 x10E3/uL   Immature Granulocytes 0 Not Estab. %   Immature Grans (Abs) 0.0 0.0 - 0.1 x10E3/uL  CMP14+EGFR     Status: None   Collection Time: 04/27/22 12:20 PM  Result Value Ref Range   Glucose 88 70 - 99 mg/dL   BUN 12 6 - 20 mg/dL   Creatinine, Ser 9.14 0.57 - 1.00 mg/dL   eGFR 85 >78 GN/FAO/1.30   BUN/Creatinine Ratio 13 9 - 23   Sodium 138 134 - 144 mmol/L   Potassium 3.8 3.5 - 5.2 mmol/L   Chloride 103 96 - 106 mmol/L   CO2 20 20 - 29 mmol/L   Calcium 9.2 8.7 - 10.2 mg/dL   Total Protein 7.5 6.0 - 8.5 g/dL   Albumin 4.5 3.9 - 4.9 g/dL   Globulin, Total 3.0 1.5 - 4.5 g/dL   Albumin/Globulin Ratio 1.5 1.2 - 2.2   Bilirubin Total 0.3 0.0 - 1.2 mg/dL   Alkaline Phosphatase 58 44 - 121 IU/L   AST 19 0 - 40 IU/L   ALT 14 0 - 32 IU/L  TSH     Status: None   Collection Time: 04/27/22 12:20 PM   Result Value Ref Range   TSH 1.580 0.450 - 4.500 uIU/mL  Hemoglobin A1c     Status: Abnormal   Collection Time: 04/27/22 12:20 PM  Result Value Ref Range   Hgb A1c MFr Bld 5.7 (H) 4.8 - 5.6 %    Comment:          Prediabetes: 5.7 - 6.4          Diabetes: >6.4          Glycemic control for adults with diabetes: <7.0    Est. average glucose Bld gHb Est-mCnc 117 mg/dL  Vitamin Q65     Status: None   Collection Time: 04/27/22 12:20 PM  Result Value Ref Range   Vitamin B-12 926 232 - 1,245 pg/mL  Iron, TIBC and Ferritin Panel     Status: None   Collection Time: 04/27/22 12:20 PM  Result Value Ref Range   Total Iron Binding Capacity 360 250 - 450 ug/dL   UIBC 784 696 - 295 ug/dL   Iron 95 27 - 284 ug/dL   Iron Saturation 26 15 - 55 %   Ferritin 63 15 - 150 ng/mL  Zinc     Status: None   Collection Time: 04/27/22 12:20 PM  Result Value Ref Range   Zinc 80 44 - 115 ug/dL  Comment:                                 Detection Limit = 5  Vitamin B6     Status: None   Collection Time: 04/27/22 12:20 PM  Result Value Ref Range   Vitamin B6 20.2 3.4 - 65.2 ug/L    Comment:                              Deficiency:         <3.4                              Marginal:      3.4 - 5.1                              Adequate:           >5.1   Magnesium     Status: None   Collection Time: 04/27/22 12:20 PM  Result Value Ref Range   Magnesium 2.1 1.6 - 2.3 mg/dL  Inflammatory Bowel Disease-IBD     Status: Abnormal   Collection Time: 04/27/22 12:20 PM  Result Value Ref Range   Saccharomyces cerevisiae, IgG 36.9 (H) 0.0 - 24.9 Units    Comment:                                 Negative         <20.0                                 Equivocal  20.1 - 24.9                                 Positive     >or= 25.0    Saccharomyces cerevisiae, IgA <20.0 0.0 - 24.9 Units    Comment:                                  Negative        <20.0                                  Equivocal 20.1 - 24.9                                   Positive    >or= 25.0 IgA and IgG antibody testing for S. cerevisiae is useful adjunct testing for differentiating Crohn's disease and ulcerative colitis. Close to 80% of Crohn's disease patients are positive for either IgA or IgG. In ulcerative colitis, less than 15% are positive for IgG and less than 2% are positive for IgA. Fewer than 5% are positive for either IgG or IgA antibody, and no healthy controls had antibody for both.    Atypical pANCA <1:20 Neg:<1:20 titer    Comment: The atypical pANCA pattern has been observed in a significant percentage  of patients with ulcerative colitis, primary sclerosing cholangitis and autoimmune hepatitis.           ASCA+/PANCA- Suggestive of Crohn's disease           ASCA-/PANCA+ Suggestive of Ulcerative colitis   Pancreatic Elastase, Fecal     Status: None   Collection Time: 04/28/22 10:48 AM  Result Value Ref Range   Pancreatic Elastase, Fecal 313 >200 ug Elast./g    Comment:        Severe Pancreatic Insufficiency:          <100        Moderate Pancreatic Insufficiency:   100 - 200        Normal:                                   >200   Celiac panel 10     Status: None   Collection Time: 05/11/22  1:44 PM  Result Value Ref Range   Antigliadin Abs, IgA 3 0 - 19 units    Comment:                    Negative                   0 - 19                    Weak Positive             20 - 30                    Moderate to Strong Positive   >30    Gliadin IgG 2 0 - 19 units    Comment:                    Negative                   0 - 19                    Weak Positive             20 - 30                    Moderate to Strong Positive   >30    Transglutaminase IgA <2 0 - 3 U/mL    Comment:                               Negative        0 -  3                               Weak Positive   4 - 10                               Positive           >10  Tissue Transglutaminase (tTG) has been identified  as the endomysial  antigen.  Studies have demonstr-  ated that endomysial IgA antibodies have over 99%  specificity for gluten sensitive enteropathy.    Tissue Transglut Ab <2 0 - 5 U/mL    Comment:  Negative        0 - 5                               Weak Positive   6 - 9                               Positive           >9    Endomysial IgA Negative Negative   IgA/Immunoglobulin A, Serum 169 87 - 352 mg/dL       Assessment & Plan:   Problem List Items Addressed This Visit     Barrett's esophagus with low grade dysplasia   Relevant Orders   Celiac panel 10 (Completed)   Other Visit Diagnoses     Generalized abdominal pain    -  Primary   Relevant Orders   Celiac panel 10 (Completed)   Non-celiac gluten sensitivity       Relevant Orders   Celiac panel 10 (Completed)       No follow-ups on file.   Total time spent: 30 minutes  Miki Kins, FNP  05/11/2022

## 2022-06-01 ENCOUNTER — Encounter: Payer: Self-pay | Admitting: Family

## 2022-06-01 ENCOUNTER — Ambulatory Visit: Payer: Managed Care, Other (non HMO) | Admitting: Family

## 2022-06-01 VITALS — BP 124/68 | HR 101 | Ht 68.0 in | Wt 148.0 lb

## 2022-06-01 DIAGNOSIS — R42 Dizziness and giddiness: Secondary | ICD-10-CM

## 2022-06-01 NOTE — Progress Notes (Signed)
Established Patient Office Visit  Subjective:  Patient ID: Cynthia Mcintosh, female    DOB: Jun 05, 1985  Age: 37 y.o. MRN: [id number]  Chief Complaint  Patient presents with   Follow-up    Balance issues, dizziness    Is working with nutritionist, and has been going well.   Dizzy spells, sometimes happening when she is just standing, sometimes when she gets up.    No other concerns at this time.   Past Medical History:  Diagnosis Date   Anxiety    Asthma 05/06/2019   COVID     Past Surgical History:  Procedure Laterality Date   TONSILECTOMY/ADENOIDECTOMY WITH MYRINGOTOMY      Social History   Socioeconomic History   Marital status: Single    Spouse name: Not on file   Number of children: Not on file   Years of education: Not on file   Highest education level: Not on file  Occupational History   Not on file  Tobacco Use   Smoking status: Never   Smokeless tobacco: Never  Vaping Use   Vaping Use: Never used  Substance and Sexual Activity   Alcohol use: Not Currently   Drug use: Not Currently   Sexual activity: Yes    Birth control/protection: Pill  Other Topics Concern   Not on file  Social History Narrative   Not on file   Social Determinants of Health   Financial Resource Strain: Not on file  Food Insecurity: Not on file  Transportation Needs: Not on file  Physical Activity: Not on file  Stress: Not on file  Social Connections: Not on file  Intimate Partner Violence: Not on file    Family History  Problem Relation Age of Onset   Hypertension Mother    Hypercholesterolemia Mother    Hypertension Father    Thyroid disease Father    COPD Maternal Grandmother    Diabetes Maternal Grandfather    Hypertension Maternal Grandfather    Stroke Maternal Grandfather     No Known Allergies  Review of Systems  All other systems reviewed and are negative.      Objective:   BP 124/68   Pulse (!) 101   Ht 5\' 8"  (1.727 m)   Wt 148 lb (67.1 kg)    SpO2 98%   BMI 22.50 kg/m   Vitals:   06/01/22 1400  BP: 124/68  Pulse: (!) 101  Height: 5\' 8"  (1.727 m)  Weight: 148 lb (67.1 kg)  SpO2: 98%  BMI (Calculated): 22.51    Physical Exam Vitals and nursing note reviewed.  Constitutional:      Appearance: Normal appearance. She is normal weight.  HENT:     Head: Normocephalic.  Eyes:     Pupils: Pupils are equal, round, and reactive to light.  Cardiovascular:     Rate and Rhythm: Normal rate.  Pulmonary:     Effort: Pulmonary effort is normal.  Neurological:     Mental Status: She is alert.      No results found for any visits on 06/01/22.  Recent Results (from the past 2160 hour(s))  Cervicovaginal ancillary only     Status: None   Collection Time: 03/03/22  2:14 PM  Result Value Ref Range   Neisseria Gonorrhea Negative    Chlamydia Negative    Trichomonas Negative    Comment Normal Reference Range Trichomonas - Negative    Comment Normal Reference Ranger Chlamydia - Negative    Comment  Normal Reference Range Neisseria Gonorrhea - Negative  Hepatitis B surface antigen     Status: None   Collection Time: 03/03/22  2:40 PM  Result Value Ref Range   Hepatitis B Surface Ag Negative Negative  Hepatitis C antibody     Status: None   Collection Time: 03/03/22  2:40 PM  Result Value Ref Range   Hep C Virus Ab Non Reactive Non Reactive    Comment: HCV antibody alone does not differentiate between previously resolved infection and active infection. Equivocal and Reactive HCV antibody results should be followed up with an HCV RNA test to support the diagnosis of active HCV infection.   HIV Antibody (routine testing w rflx)     Status: None   Collection Time: 03/03/22  2:40 PM  Result Value Ref Range   HIV Screen 4th Generation wRfx Non Reactive Non Reactive    Comment: HIV Negative HIV-1/HIV-2 antibodies and HIV-1 p24 antigen were NOT detected. There is no laboratory evidence of HIV infection.   RPR      Status: None   Collection Time: 03/03/22  2:40 PM  Result Value Ref Range   RPR Ser Ql Non Reactive Non Reactive  HSV 1 and 2 Ab, IgG     Status: None   Collection Time: 03/03/22  2:40 PM  Result Value Ref Range   HSV 1 Glycoprotein G Ab, IgG <0.91 0.00 - 0.90 index    Comment:                                  Negative        <0.91                                  Equivocal 0.91 - 1.09                                  Positive        >1.09  Note: Negative indicates no antibodies detected to  HSV-1. Equivocal may suggest early infection.  If  clinically appropriate, retest at later date. Positive  indicates antibodies detected to HSV-1.    HSV 2 IgG, Type Spec <0.91 0.00 - 0.90 index    Comment:                                  Negative        <0.91                                  Equivocal 0.91 - 1.09                                  Positive        >1.09  HSV-2 Antibody Interpretation: Current guidelines and  recommendations do not recommend routine screening  for HSV-2 in asymptomatic individuals, including  those that are pregnant. A negative antibody result  indicates no detectable antibodies to HSV-2 were  found. If recent exposure is suspected, retest in 4  to 6 weeks. Equivocal samples should be retested in  4 to 6  weeks. A positive result indicates the  presence of detectable IgG antibody to HSV-2.  FALSE POSITIVE RESULTS MAY OCCUR. Repeat testing, or  testing by a different method, may be indicated in  some settings (e.g. patients with low likelihood of  HSV infection). If clinically appropriate, retest 4  to 6 weeks later. HSV-2 IgG antibody testing results  should be clinically correlated.   Lipase, blood     Status: None   Collection Time: 04/19/22 10:41 AM  Result Value Ref Range   Lipase 32 11 - 51 U/L    Comment: Performed at [facility], [address] [place], [place] [address]  Comprehensive metabolic panel     Status: None   Collection Time:  04/19/22 10:41 AM  Result Value Ref Range   Sodium 138 135 - 145 mmol/L   Potassium 3.9 3.5 - 5.1 mmol/L   Chloride 102 98 - 111 mmol/L   CO2 25 22 - 32 mmol/L   Glucose, Bld 95 70 - 99 mg/dL    Comment: Glucose reference range applies only to samples taken after fasting for at least 8 hours.   BUN 9 6 - 20 mg/dL   Creatinine, Ser 6.04 0.44 - 1.00 mg/dL   Calcium 9.7 8.9 - 54.0 mg/dL   Total Protein 7.7 6.5 - 8.1 g/dL   Albumin 4.0 3.5 - 5.0 g/dL   AST 23 15 - 41 U/L   ALT 16 0 - 44 U/L   Alkaline Phosphatase 48 38 - 126 U/L   Total Bilirubin 0.6 0.3 - 1.2 mg/dL   GFR, Estimated >98 >11 mL/min    Comment: (NOTE) Calculated using the CKD-EPI Creatinine Equation (2021)    Anion gap 11 5 - 15    Comment: Performed at [facility], [address] [place], [place] [address]  CBC     Status: None   Collection Time: 04/19/22 10:41 AM  Result Value Ref Range   WBC 6.9 4.0 - 10.5 K/uL   RBC 4.14 3.87 - 5.11 MIL/uL   Hemoglobin 13.1 12.0 - 15.0 g/dL   HCT 29.5 62.1 - 30.8 %   MCV 95.9 80.0 - 100.0 fL   MCH 31.6 26.0 - 34.0 pg   MCHC 33.0 30.0 - 36.0 g/dL   RDW 65.7 84.6 - 96.2 %   Platelets 226 150 - 400 K/uL   nRBC 0.0 0.0 - 0.2 %    Comment: Performed at [facility], [address] [place], [place] [address]  Urinalysis, Routine w reflex microscopic -Urine, Clean Catch     Status: Abnormal   Collection Time: 04/19/22 11:50 AM  Result Value Ref Range   Color, Urine STRAW (A) YELLOW   APPearance CLEAR (A) CLEAR   Specific Gravity, Urine 1.004 (L) 1.005 - 1.030   pH 8.0 5.0 - 8.0   Glucose, UA NEGATIVE NEGATIVE mg/dL   Hgb urine dipstick NEGATIVE NEGATIVE   Bilirubin Urine NEGATIVE NEGATIVE   Ketones, ur NEGATIVE NEGATIVE mg/dL   Protein, ur NEGATIVE NEGATIVE mg/dL   Nitrite NEGATIVE NEGATIVE   Leukocytes,Ua NEGATIVE NEGATIVE   RBC / HPF 0-5 0 - 5 RBC/hpf   WBC, UA 0-5 0 - 5 WBC/hpf   Bacteria, UA RARE (A) NONE SEEN   Squamous Epithelial / HPF 0-5 0 -  5 /HPF    Comment: Performed at [facility], [address], [place], [place] [address]  Lipid panel     Status: None   Collection Time: 04/27/22  12:20 PM  Result Value Ref Range   Cholesterol, Total 152 100 - 199 mg/dL   Triglycerides 63 0 - 149 mg/dL   HDL 73 >69 mg/dL   VLDL Cholesterol Cal 13 5 - 40 mg/dL   LDL Chol Calc (NIH) 66 0 - 99 mg/dL   Chol/HDL Ratio 2.1 0.0 - 4.4 ratio    Comment:                                   T. Chol/HDL Ratio                                             Men  Women                               1/2 Avg.Risk  3.4    3.3                                   Avg.Risk  5.0    4.4                                2X Avg.Risk  9.6    7.1                                3X Avg.Risk 23.4   11.0   VITAMIN D 25 Hydroxy (Vit-D Deficiency, Fractures)     Status: Abnormal   Collection Time: 04/27/22 12:20 PM  Result Value Ref Range   Vit D, 25-Hydroxy 28.2 (L) 30.0 - 100.0 ng/mL    Comment: Vitamin D deficiency has been defined by the Institute of Medicine and an Endocrine Society practice guideline as a level of serum 25-OH vitamin D less than 20 ng/mL (1,2). The Endocrine Society went on to further define vitamin D insufficiency as a level between 21 and 29 ng/mL (2). 1. IOM (Institute of Medicine). 2010. Dietary reference    intakes for calcium and D. [place]: The    [employer]. 2. Holick MF, Binkley [place], Bischoff-Ferrari HA, et al.    Evaluation, treatment, and prevention of vitamin D    deficiency: an Endocrine Society clinical practice    guideline. JCEM. 2011 Jul; 96(7):1911-30.   CBC With Differential     Status: None   Collection Time: 04/27/22 12:20 PM  Result Value Ref Range   WBC 6.7 3.4 - 10.8 x10E3/uL   RBC 4.13 3.77 - 5.28 x10E6/uL   Hemoglobin 13.0 11.1 - 15.9 g/dL   Hematocrit 62.9 52.8 - 46.6 %   MCV 96 79 - 97 fL   MCH 31.5 26.6 - 33.0 pg   MCHC 32.9 31.5 - 35.7 g/dL   RDW 41.3 24.4 - 01.0 %   Neutrophils  57 Not Estab. %   Lymphs 34 Not Estab. %   Monocytes 8 Not Estab. %   Eos 1 Not Estab. %   Basos 0 Not Estab. %   Neutrophils Absolute 3.8 1.4 - 7.0 x10E3/uL   Lymphocytes Absolute 2.3 0.7 - 3.1 x10E3/uL   Monocytes  Absolute 0.5 0.1 - 0.9 x10E3/uL   EOS (ABSOLUTE) 0.1 0.0 - 0.4 x10E3/uL   Basophils Absolute 0.0 0.0 - 0.2 x10E3/uL   Immature Granulocytes 0 Not Estab. %   Immature Grans (Abs) 0.0 0.0 - 0.1 x10E3/uL  CMP14+EGFR     Status: None   Collection Time: 04/27/22 12:20 PM  Result Value Ref Range   Glucose 88 70 - 99 mg/dL   BUN 12 6 - 20 mg/dL   Creatinine, Ser 1.61 0.57 - 1.00 mg/dL   eGFR 85 >09 UE/AVW/0.98   BUN/Creatinine Ratio 13 9 - 23   Sodium 138 134 - 144 mmol/L   Potassium 3.8 3.5 - 5.2 mmol/L   Chloride 103 96 - 106 mmol/L   CO2 20 20 - 29 mmol/L   Calcium 9.2 8.7 - 10.2 mg/dL   Total Protein 7.5 6.0 - 8.5 g/dL   Albumin 4.5 3.9 - 4.9 g/dL   Globulin, Total 3.0 1.5 - 4.5 g/dL   Albumin/Globulin Ratio 1.5 1.2 - 2.2   Bilirubin Total 0.3 0.0 - 1.2 mg/dL   Alkaline Phosphatase 58 44 - 121 IU/L   AST 19 0 - 40 IU/L   ALT 14 0 - 32 IU/L  TSH     Status: None   Collection Time: 04/27/22 12:20 PM  Result Value Ref Range   TSH 1.580 0.450 - 4.500 uIU/mL  Hemoglobin A1c     Status: Abnormal   Collection Time: 04/27/22 12:20 PM  Result Value Ref Range   Hgb A1c MFr Bld 5.7 (H) 4.8 - 5.6 %    Comment:          Prediabetes: 5.7 - 6.4          Diabetes: >6.4          Glycemic control for adults with diabetes: <7.0    Est. average glucose Bld gHb Est-mCnc 117 mg/dL  Vitamin J19     Status: None   Collection Time: 04/27/22 12:20 PM  Result Value Ref Range   Vitamin B-12 926 232 - 1,245 pg/mL  Iron, TIBC and Ferritin Panel     Status: None   Collection Time: 04/27/22 12:20 PM  Result Value Ref Range   Total Iron Binding Capacity 360 250 - 450 ug/dL   UIBC 147 829 - 562 ug/dL   Iron 95 27 - 130 ug/dL   Iron Saturation 26 15 - 55 %   Ferritin 63 15 - 150 ng/mL   Zinc     Status: None   Collection Time: 04/27/22 12:20 PM  Result Value Ref Range   Zinc 80 44 - 115 ug/dL    Comment:                                 Detection Limit = 5  Vitamin B6     Status: None   Collection Time: 04/27/22 12:20 PM  Result Value Ref Range   Vitamin B6 20.2 3.4 - 65.2 ug/L    Comment:                              Deficiency:         <3.4                              Marginal:  3.4 - 5.1                              Adequate:           >5.1   Magnesium     Status: None   Collection Time: 04/27/22 12:20 PM  Result Value Ref Range   Magnesium 2.1 1.6 - 2.3 mg/dL  Inflammatory Bowel Disease-IBD     Status: Abnormal   Collection Time: 04/27/22 12:20 PM  Result Value Ref Range   Saccharomyces cerevisiae, IgG 36.9 (H) 0.0 - 24.9 Units    Comment:                                 Negative         <20.0                                 Equivocal  20.1 - 24.9                                 Positive     >or= 25.0    Saccharomyces cerevisiae, IgA <20.0 0.0 - 24.9 Units    Comment:                                  Negative        <20.0                                  Equivocal 20.1 - 24.9                                  Positive    >or= 25.0 IgA and IgG antibody testing for S. cerevisiae is useful adjunct testing for differentiating Crohn's disease and ulcerative colitis. Close to 80% of Crohn's disease patients are positive for either IgA or IgG. In ulcerative colitis, less than 15% are positive for IgG and less than 2% are positive for IgA. Fewer than 5% are positive for either IgG or IgA antibody, and no healthy controls had antibody for both.    Atypical pANCA <1:20 Neg:<1:20 titer    Comment: The atypical pANCA pattern has been observed in a significant percentage of patients with ulcerative colitis, primary sclerosing cholangitis and autoimmune hepatitis.           ASCA+/PANCA- Suggestive of Crohn's disease           ASCA-/PANCA+ Suggestive of  Ulcerative colitis   Pancreatic Elastase, Fecal     Status: None   Collection Time: 04/28/22 10:48 AM  Result Value Ref Range   Pancreatic Elastase, Fecal 313 >200 ug Elast./g    Comment:        Severe Pancreatic Insufficiency:          <100        Moderate Pancreatic Insufficiency:   100 - 200        Normal:                                   >  200   Celiac panel 10     Status: None   Collection Time: 05/11/22  1:44 PM  Result Value Ref Range   Antigliadin Abs, IgA 3 0 - 19 units    Comment:                    Negative                   0 - 19                    Weak Positive             20 - 30                    Moderate to Strong Positive   >30    Gliadin IgG 2 0 - 19 units    Comment:                    Negative                   0 - 19                    Weak Positive             20 - 30                    Moderate to Strong Positive   >30    Transglutaminase IgA <2 0 - 3 U/mL    Comment:                               Negative        0 -  3                               Weak Positive   4 - 10                               Positive           >10  Tissue Transglutaminase (tTG) has been identified  as the endomysial antigen.  Studies have demonstr-  ated that endomysial IgA antibodies have over 99%  specificity for gluten sensitive enteropathy.    Tissue Transglut Ab <2 0 - 5 U/mL    Comment:                               Negative        0 - 5                               Weak Positive   6 - 9                               Positive           >9    Endomysial IgA Negative Negative   IgA/Immunoglobulin A, Serum 169 87 - 352 mg/dL       Assessment & Plan:   Problem List Items Addressed This Visit   None Visit Diagnoses  Dizziness    -  Primary   Relevant Orders   Magnesium       Return once labs are back.   Total time spent: 20 minutes  Miki Kins, FNP  06/01/2022

## 2022-06-02 ENCOUNTER — Encounter: Payer: Self-pay | Admitting: Family

## 2022-06-02 LAB — MAGNESIUM: Magnesium: 2.2 mg/dL (ref 1.6–2.3)

## 2022-06-08 ENCOUNTER — Encounter: Payer: Self-pay | Admitting: Gastroenterology

## 2022-06-08 ENCOUNTER — Ambulatory Visit: Payer: Managed Care, Other (non HMO) | Admitting: Gastroenterology

## 2022-06-08 VITALS — BP 102/68 | HR 110 | Temp 98.2°F | Ht 68.0 in | Wt 150.5 lb

## 2022-06-08 DIAGNOSIS — K9041 Non-celiac gluten sensitivity: Secondary | ICD-10-CM

## 2022-06-08 NOTE — Progress Notes (Signed)
Arlyss Repress, MD [address]  [address]  [place], [place] [address]  Main: [phone]  Fax: [phone]    Gastroenterology Consultation  Referring Provider:     Margaretann Loveless, MD Primary Care Physician:  Miki Kins, FNP Primary Gastroenterologist:  Dr. Arlyss Repress Reason for Consultation:  ?  Celiac disease        HPI:   Cynthia Mcintosh is a 37 y.o. female referred by Miki Kins, FNP  for consultation & management of possible celiac disease.  Patient reports that she has been experiencing symptoms of chronic nausea, severe symptoms of brain fog, unable to focus, abdominal cramps, abdominal bloating.  Patient was previously seen by [facility] in [place], [place] when she was living in [place].  Moved to [place] area, wanted to establish care with GI locally.  Apparently, she was referred to [facility] in 10/2021, her referral was not accepted.  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  She is seeing nutritionist through [facility] and following strict gluten-free diet.  Her celiac disease panel is negative, no history of iron deficiency or B12 deficiency anemia.  She reports that all her GI symptoms and brain fog has resolved and she is currently symptom-free.  She wanted to establish care with GI locally in case if she develops any symptoms  Workup in the past - 07/24/2019: EGD [facility], [place], [place]): Indication bloating, diarrhea, belching.  Localized erythema at GEJ, Non-H. pylori gastritis.  Normal duodenum with benign biopsies - 03/11/2021: Abdominal ultrasound: Indication abdominal pain x1 month: Normal GB, CBD 3 mm, 2.4 x 2.2 x 2.0 hepatic hemangioma.  Otherwise normal liver, pancreas, spleen - 06/19/2021: Normal CMP, CBC.  Negative/normal celiac panel.  Normal food allergy testing.  Normal lipase - 09/16/2021: Negative/normal ova and parasite,  Giardia, C. difficile, Salmonella, Campylobacter, E. coli, fecal lactoferrin  Patient does not smoke or drink alcohol  Family history of lupus, no known history of IBD or celiac disease  NSAIDs: None  Antiplts/Anticoagulants/Anti thrombotics: None  GI Procedures: None  Past Medical History:  Diagnosis Date   Anxiety    Asthma 05/06/2019   COVID     Past Surgical History:  Procedure Laterality Date   TONSILECTOMY/ADENOIDECTOMY WITH MYRINGOTOMY       Current Outpatient Medications:    albuterol (VENTOLIN HFA) 108 (90 Base) MCG/ACT inhaler, Inhale 2 puffs into the lungs every 6 (six) hours as needed for wheezing or shortness of breath., Disp: 8 g, Rfl: 2   LORazepam (ATIVAN) 1 MG tablet, Take by mouth., Disp: , Rfl:    Multiple Vitamin (MULTI-VITAMIN) tablet, Take 1 tablet by mouth daily., Disp: , Rfl:    RYALTRIS 665-25 MCG/ACT SUSP, SMARTSIG:1-2 Spray(s) Both Nares Daily, Disp: , Rfl:    Family History  Problem Relation Age of Onset   Hypertension Mother    Hypercholesterolemia Mother    Hypertension Father    Thyroid disease Father    COPD Maternal Grandmother    Diabetes Maternal Grandfather    Hypertension Maternal Grandfather    Stroke Maternal Grandfather      Social History   Tobacco Use   Smoking status: Never   Smokeless tobacco: Never  Vaping Use   Vaping Use: Never used  Substance Use Topics   Alcohol use: Not Currently   Drug use: Not Currently    Allergies as of 06/08/2022   (  No Known Allergies)    Review of Systems:    All systems reviewed and negative except where noted in HPI.   Physical Exam:  BP 102/68 (BP Location: Left Arm, Patient Position: Sitting, Cuff Size: Normal)   Pulse (!) 110   Temp 98.2 F (36.8 C) (Oral)   Ht 5\' 8"  (1.727 m)   Wt 150 lb 8 oz (68.3 kg)   BMI 22.88 kg/m  No LMP recorded. (Menstrual status: Oral contraceptives).  General:   Alert,  Well-developed, well-nourished, pleasant and cooperative in NAD Head:   Normocephalic and atraumatic. Eyes:  Sclera clear, no icterus.   Conjunctiva pink. Ears:  Normal auditory acuity. Nose:  No deformity, discharge, or lesions. Mouth:  No deformity or lesions,oropharynx pink & moist. Neck:  Supple; no masses or thyromegaly. Lungs:  Respirations even and unlabored.  Clear throughout to auscultation.   No wheezes, crackles, or rhonchi. No acute distress. Heart:  Regular rate and rhythm; no murmurs, clicks, rubs, or gallops. Abdomen:  Normal bowel sounds. Soft, non-tender and non-distended without masses, hepatosplenomegaly or hernias noted.  No guarding or rebound tenderness.   Rectal: Not performed Msk:  Symmetrical without gross deformities. Good, equal movement & strength bilaterally. Pulses:  Normal pulses noted. Extremities:  No clubbing or edema.  No cyanosis. Neurologic:  Alert and oriented x3;  grossly normal neurologically. Skin:  Intact without significant lesions or rashes. No jaundice. Psych:  Alert and cooperative. Normal mood and affect.  Imaging Studies: Reviewed  Assessment and Plan:   Cynthia Mcintosh is a 37 y.o. pleasant female with history of COVID, anxiety is seen in consultation to establish care.  Has been experiencing chronic symptoms of brain fog, abdominal bloating, cramps, fullness, chronic nausea.  Currently following strict gluten-free diet with complete resolution of her symptoms Per patient report, she carries DQ 2 allele.  Celiac disease panel negative.  She probably has gluten intolerance which is also a spectrum of IBS Continue gluten-free diet Discussed with patient that in order to rule in or rule out celiac disease, she has to perform gluten challenge to proceed with upper endoscopy and duodenal biopsies and after detailed discussion, we made the decision to defer upper endoscopy at this time unless her symptoms recur   Follow up as needed   Arlyss Repress, MD

## 2022-06-09 ENCOUNTER — Encounter: Payer: Self-pay | Admitting: Family

## 2022-07-14 ENCOUNTER — Ambulatory Visit: Payer: Managed Care, Other (non HMO) | Admitting: Family

## 2022-07-14 ENCOUNTER — Encounter: Payer: Self-pay | Admitting: Family

## 2022-07-14 VITALS — BP 120/85 | HR 71 | Ht 67.0 in | Wt 146.4 lb

## 2022-07-14 DIAGNOSIS — R5383 Other fatigue: Secondary | ICD-10-CM | POA: Diagnosis not present

## 2022-07-14 DIAGNOSIS — R946 Abnormal results of thyroid function studies: Secondary | ICD-10-CM

## 2022-07-14 DIAGNOSIS — R55 Syncope and collapse: Secondary | ICD-10-CM

## 2022-07-14 DIAGNOSIS — K591 Functional diarrhea: Secondary | ICD-10-CM

## 2022-07-14 DIAGNOSIS — R42 Dizziness and giddiness: Secondary | ICD-10-CM

## 2022-07-14 DIAGNOSIS — R4189 Other symptoms and signs involving cognitive functions and awareness: Secondary | ICD-10-CM | POA: Diagnosis not present

## 2022-07-14 DIAGNOSIS — E611 Iron deficiency: Secondary | ICD-10-CM | POA: Diagnosis not present

## 2022-07-14 DIAGNOSIS — M542 Cervicalgia: Secondary | ICD-10-CM

## 2022-07-16 LAB — TSH+T4F+T3FREE+THYABS+TPO+VD25
Free T4: 1.28 ng/dL (ref 0.82–1.77)
T3, Free: 2.6 pg/mL (ref 2.0–4.4)
TSH: 1.85 u[IU]/mL (ref 0.450–4.500)
Thyroglobulin Antibody: <1 IU/mL (ref 0.0–0.9)
Thyroperoxidase Ab SerPl-aCnc: 246 IU/mL — ABNORMAL HIGH (ref 0–34)
Vit D, 25-Hydroxy: 35.1 ng/mL (ref 30.0–100.0)

## 2022-07-16 LAB — T3, REVERSE: Reverse T3, Serum: 23.7 ng/dL (ref 9.2–24.1)

## 2022-07-16 LAB — COPPER, SERUM: Copper: 125 ug/dL (ref 80–158)

## 2022-07-21 ENCOUNTER — Encounter: Payer: Self-pay | Admitting: Family

## 2022-07-21 DIAGNOSIS — Q682 Congenital deformity of knee: Secondary | ICD-10-CM | POA: Insufficient documentation

## 2022-07-21 DIAGNOSIS — M25562 Pain in left knee: Secondary | ICD-10-CM | POA: Insufficient documentation

## 2022-07-21 NOTE — Telephone Encounter (Signed)
Referral was already sent 6/19

## 2022-07-26 ENCOUNTER — Telehealth: Payer: Self-pay | Admitting: Gastroenterology

## 2022-07-26 NOTE — Telephone Encounter (Signed)
Patient called in and want to switch provider. She is a patient of Dr. Allegra Lai but she would like to be a patient of Dr. Tobi Bastos. She stated that she was unable to discuss her problem with Dr. Allegra Lai she felt like Dr. Allegra Lai was talking over her, talking over her and brushing her issue off. She would like to transfer her care to Dr. Tobi Bastos. Please okay transfer.

## 2022-07-27 ENCOUNTER — Telehealth: Payer: Self-pay | Admitting: Family

## 2022-07-27 NOTE — Telephone Encounter (Signed)
Patient left VM that she called to schedule an appointment for her neurology referral. When she called the neurologist office they said that the note from here needed to be "closed" before they could schedule her. Please sign and close note if not already done so.

## 2022-07-29 ENCOUNTER — Encounter: Payer: Self-pay | Admitting: Family

## 2022-07-29 NOTE — Progress Notes (Signed)
Established Patient Office Visit  Subjective:  Patient ID: Cynthia Mcintosh, female    DOB: 1985/10/08  Age: 37 y.o. MRN: [id number]  Chief Complaint  Patient presents with   Follow-up    Follow up    Patient is here for follow up.  She has seen the functional medicine provider that she was working with previously and is taking supplements to help with her gut health.  She is feeling better, but she does still have some adjustments to make to the plan of care with them   She is more concerned today about her brain fog, numbness and tingling, fatigue, as well as some dizziness and postural pre-syncope.  She also asks if we can set up referral to endocrine for her as her thyroid labs were abnormal.    No other concerns at this time.   Past Medical History:  Diagnosis Date   Anxiety    Asthma 05/06/2019   COVID     Past Surgical History:  Procedure Laterality Date   TONSILECTOMY/ADENOIDECTOMY WITH MYRINGOTOMY      Social History   Socioeconomic History   Marital status: Single    Spouse name: Not on file   Number of children: Not on file   Years of education: Not on file   Highest education level: Not on file  Occupational History   Not on file  Tobacco Use   Smoking status: Never   Smokeless tobacco: Never  Vaping Use   Vaping Use: Never used  Substance and Sexual Activity   Alcohol use: Not Currently   Drug use: Not Currently   Sexual activity: Yes    Birth control/protection: Pill  Other Topics Concern   Not on file  Social History Narrative   Not on file   Social Determinants of Health   Financial Resource Strain: Not on file  Food Insecurity: Not on file  Transportation Needs: Not on file  Physical Activity: Not on file  Stress: Not on file  Social Connections: Not on file  Intimate Partner Violence: Not on file    Family History  Problem Relation Age of Onset   Hypertension Mother    Hypercholesterolemia Mother    Hypertension Father     Thyroid disease Father    COPD Maternal Grandmother    Diabetes Maternal Grandfather    Hypertension Maternal Grandfather    Stroke Maternal Grandfather     No Known Allergies  Review of Systems  Constitutional:  Positive for malaise/fatigue.  Musculoskeletal:  Positive for neck pain.  Neurological:  Positive for dizziness, tingling, weakness and headaches.  All other systems reviewed and are negative.      Objective:   BP 120/85   Pulse 71   Ht 5\' 7"  (1.702 m)   Wt 146 lb 6.4 oz (66.4 kg)   SpO2 97%   BMI 22.93 kg/m   Vitals:   07/14/22 1410  BP: 120/85  Pulse: 71  Height: 5\' 7"  (1.702 m)  Weight: 146 lb 6.4 oz (66.4 kg)  SpO2: 97%  BMI (Calculated): 22.92    Physical Exam Vitals and nursing note reviewed.  Constitutional:      Appearance: Normal appearance. She is normal weight.  HENT:     Head: Normocephalic.  Eyes:     Extraocular Movements: Extraocular movements intact.     Conjunctiva/sclera: Conjunctivae normal.     Pupils: Pupils are equal, round, and reactive to light.  Cardiovascular:     Rate and Rhythm: Normal rate.  Pulmonary:     Effort: Pulmonary effort is normal.  Neurological:     General: No focal deficit present.     Mental Status: She is alert and oriented to person, place, and time. Mental status is at baseline.  Psychiatric:        Attention and Perception: Attention and perception normal.        Mood and Affect: Mood is anxious.        Speech: Speech normal.        Behavior: Behavior normal. Behavior is cooperative.        Thought Content: Thought content normal.        Cognition and Memory: Cognition normal.        Judgment: Judgment normal.      Results for orders placed or performed in visit on 07/14/22  T3, reverse  Result Value Ref Range   Reverse T3, Serum 23.7 9.2 - 24.1 ng/dL  WUJ+W1X+B1YNWG+NFAOZH+YQM+VH84  Result Value Ref Range   Vit D, 25-Hydroxy 35.1 30.0 - 100.0 ng/mL   TSH 1.850 0.450 - 4.500 uIU/mL   T3,  Free 2.6 2.0 - 4.4 pg/mL   Free T4 1.28 0.82 - 1.77 ng/dL   Thyroperoxidase Ab SerPl-aCnc 246 (H) 0 - 34 IU/mL   Thyroglobulin Antibody <1.0 0.0 - 0.9 IU/mL  Copper, Serum  Result Value Ref Range   Copper 125 80 - 158 ug/dL    Recent Results (from the past 2160 hour(s))  Celiac panel 10     Status: None   Collection Time: 05/11/22  1:44 PM  Result Value Ref Range   Antigliadin Abs, IgA 3 0 - 19 units    Comment:                    Negative                   0 - 19                    Weak Positive             20 - 30                    Moderate to Strong Positive   >30    Gliadin IgG 2 0 - 19 units    Comment:                    Negative                   0 - 19                    Weak Positive             20 - 30                    Moderate to Strong Positive   >30    Transglutaminase IgA <2 0 - 3 U/mL    Comment:                               Negative        0 -  3                               Weak Positive   4 -  10                               Positive           >10  Tissue Transglutaminase (tTG) has been identified  as the endomysial antigen.  Studies have demonstr-  ated that endomysial IgA antibodies have over 99%  specificity for gluten sensitive enteropathy.    Tissue Transglut Ab <2 0 - 5 U/mL    Comment:                               Negative        0 - 5                               Weak Positive   6 - 9                               Positive           >9    Endomysial IgA Negative Negative   IgA/Immunoglobulin A, Serum 169 87 - 352 mg/dL  Magnesium     Status: None   Collection Time: 06/01/22  2:57 PM  Result Value Ref Range   Magnesium 2.2 1.6 - 2.3 mg/dL  T3, reverse     Status: None   Collection Time: 07/14/22  2:54 PM  Result Value Ref Range   Reverse T3, Serum 23.7 9.2 - 24.1 ng/dL  ZOX+W9U+E4VWUJ+WJXBJY+NWG+NF62     Status: Abnormal   Collection Time: 07/14/22  2:54 PM  Result Value Ref Range   Vit D, 25-Hydroxy 35.1 30.0 - 100.0 ng/mL     Comment: Vitamin D deficiency has been defined by the Institute of Medicine and an Endocrine Society practice guideline as a level of serum 25-OH vitamin D less than 20 ng/mL (1,2). The Endocrine Society went on to further define vitamin D insufficiency as a level between 21 and 29 ng/mL (2). 1. IOM (Institute of Medicine). 2010. Dietary reference    intakes for calcium and D. [place]: The    [employer]. 2. Holick MF, Binkley [place], Bischoff-Ferrari HA, et al.    Evaluation, treatment, and prevention of vitamin D    deficiency: an Endocrine Society clinical practice    guideline. JCEM. 2011 Jul; 96(7):1911-30.    TSH 1.850 0.450 - 4.500 uIU/mL   T3, Free 2.6 2.0 - 4.4 pg/mL   Free T4 1.28 0.82 - 1.77 ng/dL   Thyroperoxidase Ab SerPl-aCnc 246 (H) 0 - 34 IU/mL   Thyroglobulin Antibody <1.0 0.0 - 0.9 IU/mL    Comment: Thyroglobulin Antibody measured by [employer] Methodology It should be noted that the presence of thyroglobulin antibodies may not be pathogenic nor diagnostic, especially at very low levels. The assay manufacturer has found that four percent of individuals without evidence of thyroid disease or autoimmunity will have positive TgAb levels up to 4 IU/mL.   Copper, Serum     Status: None   Collection Time: 07/14/22  2:54 PM  Result Value Ref Range   Copper 125 80 - 158 ug/dL    Comment:  Detection Limit = 5       Assessment & Plan:   Problem List Items Addressed This Visit       Active Problems   Postural dizziness with presyncope   Relevant Orders   Ambulatory referral to Neurology   Other Visit Diagnoses     Other fatigue    -  Primary   Relevant Orders   T3, reverse (Completed)   TSH+T4F+T3Free+ThyAbs+TPO+VD25 (Completed)   Copper, Serum (Completed)   Iron deficiency       Relevant Orders   T3, reverse (Completed)   TSH+T4F+T3Free+ThyAbs+TPO+VD25 (Completed)   Copper, Serum (Completed)   Functional  diarrhea       Relevant Orders   T3, reverse (Completed)   TSH+T4F+T3Free+ThyAbs+TPO+VD25 (Completed)   Copper, Serum (Completed)   Brain fog       Relevant Orders   Ambulatory referral to Neurology   Neck pain       Relevant Orders   Ambulatory referral to Neurology   Abnormal results of thyroid function studies       Relevant Orders   Ambulatory referral to Endocrinology      Sending referrals for Endocrine and Neurology. Will recheck TSH, T3, T4.  There were some additional labs that the other provider had asked her to have, so we will get what we can.    Return as needed..   Total time spent: 20 minutes  Miki Kins, FNP  07/14/2022   This document may have been prepared by [facility] Voice Recognition software and as such may include unintentional dictation errors.

## 2022-08-03 NOTE — Telephone Encounter (Signed)
Duplicate message. 

## 2022-08-10 ENCOUNTER — Encounter: Payer: Self-pay | Admitting: Neurology

## 2022-08-16 ENCOUNTER — Encounter: Payer: Self-pay | Admitting: Gastroenterology

## 2022-08-16 ENCOUNTER — Telehealth: Payer: Self-pay | Admitting: Gastroenterology

## 2022-08-16 ENCOUNTER — Ambulatory Visit: Payer: Managed Care, Other (non HMO) | Admitting: Gastroenterology

## 2022-08-16 VITALS — BP 127/79 | HR 103 | Temp 98.4°F | Ht 68.0 in | Wt 145.4 lb

## 2022-08-16 DIAGNOSIS — Z8719 Personal history of other diseases of the digestive system: Secondary | ICD-10-CM

## 2022-08-16 DIAGNOSIS — R1013 Epigastric pain: Secondary | ICD-10-CM

## 2022-08-16 NOTE — Telephone Encounter (Signed)
Noted. Looks like we will see patient as scheduled.

## 2022-08-16 NOTE — Progress Notes (Signed)
Wyline Mood MD, MRCP(U.K) [address]  [address]  [place], [place] [address]  Main: [phone]  Fax: [phone]   Primary Care Physician: Miki Kins, FNP  Primary Gastroenterologist:  Dr. Wyline Mood   Chief Complaint  Patient presents with   Bloated   Nausea   Diarrhea    HPI: Cynthia Mcintosh is a 37 y.o. female   Summary of history :  She is a patient with here today to switch providers.  Used to see Dr. Allegra Lai last seen in May 2024.  Initial visit for possible celiac disease.  Varied GI symptoms for nausea complaint fall unable to focus.  Previously seen at the GI practice in [facility].  Wanted establish locally.  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  Celiac disease panel was negative on a gluten-free diet.   Workup in the past EGD in 2021 showed normal duodenal biopsies.  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 05/11/2022 celiac panel negative.  Creatinine elastase normal.  B6, zinc, iron studies normal. Prior food allergy testing has been negative  Interval history   06/08/2022-08/16/2022 Given to her entire history once again.  All began in 2021 when she had nonspecific GI symptoms of abdominal discomfort was seen by a GI doctor and based on above evaluation it was hypothesized that she potentially could have celiac disease.  Although no biopsy results are available for me to review celiac serology has been negative in the past while eating gluten which we reviewed on her laptop.  She developed COVID about 2 years back and subsequently had symptoms of urgency after bowel movements no rectal bleeding nausea vomiting.  Some symptoms have improved some persist.  She is seeing a nutritionist and multiple labs were performed including labs which we usually do not perform ourselves such as stool tests for organisms outside of the GI PCR panel as well as Cologuard testing.  Fecal calprotectin was  negative once and on the second occasion mildly elevated just above the upper limit of normal.  She would like to know if she has any inflammation in her body and if the GI tract is normal.  She does not have any diarrhea at this point of time.  Current Outpatient Medications  Medication Sig Dispense Refill   albuterol (VENTOLIN HFA) 108 (90 Base) MCG/ACT inhaler Inhale 2 puffs into the lungs every 6 (six) hours as needed for wheezing or shortness of breath. 8 g 2   LORazepam (ATIVAN) 1 MG tablet Take by mouth.     Multiple Vitamin (MULTI-VITAMIN) tablet Take 1 tablet by mouth daily.     RYALTRIS 401-02 MCG/ACT SUSP SMARTSIG:1-2 Spray(s) Both Nares Daily     No current facility-administered medications for this visit.    Allergies as of 08/16/2022   (No Known Allergies)     ROS:  General: Negative for anorexia, weight loss, fever, chills, fatigue, weakness. ENT: Negative for hoarseness, difficulty swallowing , nasal congestion. CV: Negative for chest pain, angina, palpitations, dyspnea on exertion, peripheral edema.  Respiratory: Negative for dyspnea at rest, dyspnea on exertion, cough, sputum, wheezing.  GI: See history of present illness. GU:  Negative for dysuria, hematuria, urinary incontinence, urinary frequency, nocturnal urination.  Endo: Negative for unusual weight change.    Physical Examination:   BP 127/79   Pulse (!) 103   Temp 98.4 F (36.9 C) (Oral)   Ht 5\' 8"  (1.727  m)   Wt 145 lb 6 oz (65.9 kg)   BMI 22.10 kg/m   General: Well-nourished, well-developed in no acute distress.  Eyes: No icterus. Conjunctivae pink. Mouth: Oropharyngeal mucosa moist and pink , no lesions erythema or exudate. Neuro: Alert and oriented x 3.  Grossly intact. Skin: Warm and dry, no jaundice.   Psych: Alert and cooperative, normal mood and affect.   Imaging Studies: No results found.  Assessment and Plan:   Cynthia Mcintosh is a 37 y.o. y/o female today to see me for transfer of  care.  Previously seen Dr. Allegra Lai as a new patient after she switched GI providers after moving to the area locally.  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  Review of care everywhere I cannot see any prior endoscopy reports or celiac labs.  Based on what evidence we are we do not have any clear evidence of celiac disease.  She does however have symptoms suggestive of dyspepsia versus IBS-like symptoms.  She has had an extensive workup.  There is no clear evidence of inflammation of the colonic mucosa with almost normal fecal calprotectin.  I suggested that we can perform an H. pylori breath test as well as fecal calprotectin.  Based on these results we can decide if EGD or colonoscopy may be warranted.  I have advised her to go home think about it and let me know about her decision.  I even gave her the option of consuming gluten and having repeat testing for diagnosis of celiac disease but she is not interested at this point of time   A total of 45  minutes was spent on this visit reviewing previous notes, counseling the patient on celiac disease, dyspepsia, options for testing and documenting the findings in the note.  Dr Wyline Mood  MD,MRCP [facility]) Follow up in as needed as needed

## 2022-08-16 NOTE — Telephone Encounter (Signed)
Called patient to change patient an appointment. The patient's name was on the wait list to get an early appointment. The patient didn't answer I left a voicemail asking her to call me back because we must cancel her appointment since she is switching provider she will be listed as a new patient.

## 2022-08-26 ENCOUNTER — Other Ambulatory Visit: Payer: Self-pay

## 2022-08-26 DIAGNOSIS — Z8719 Personal history of other diseases of the digestive system: Secondary | ICD-10-CM

## 2022-08-26 DIAGNOSIS — R1084 Generalized abdominal pain: Secondary | ICD-10-CM

## 2022-08-31 ENCOUNTER — Ambulatory Visit (INDEPENDENT_AMBULATORY_CARE_PROVIDER_SITE_OTHER): Payer: Managed Care, Other (non HMO) | Admitting: Neurology

## 2022-08-31 ENCOUNTER — Other Ambulatory Visit: Payer: Self-pay

## 2022-08-31 ENCOUNTER — Encounter: Payer: Self-pay | Admitting: Neurology

## 2022-08-31 VITALS — BP 128/78 | HR 93 | Ht 68.0 in | Wt 147.0 lb

## 2022-08-31 DIAGNOSIS — R202 Paresthesia of skin: Secondary | ICD-10-CM | POA: Diagnosis not present

## 2022-08-31 DIAGNOSIS — R413 Other amnesia: Secondary | ICD-10-CM

## 2022-08-31 DIAGNOSIS — R6889 Other general symptoms and signs: Secondary | ICD-10-CM

## 2022-08-31 NOTE — Patient Instructions (Signed)
MRI brain will be ordered  Please provide a copy of your neurocognitive testing

## 2022-08-31 NOTE — Progress Notes (Signed)
[facility] Clinic Note - Initial Visit   Date: 08/31/2022   Cynthia Mcintosh MRN: [id number] DOB: May 16, 1985   Dear Grayling Congress, FNP:  Thank you for your kind referral of Cynthia Mcintosh for consultation of various neurological symptoms. Although her history is well known to you, please allow [place] to reiterate it for the purpose of our medical record. The patient was accompanied to the clinic by self.    Cynthia Mcintosh is a 37 y.o. left-handed female with anxiety presenting for evaluation of headaches, cognitive problems, and tingling.   IMPRESSION/PLAN: Myriad of neurological symptoms since 2014 including tingling, head pressure, cognitive impairment with difficulty with retention and recall, and imbalance.  Neurological exam is entirely normal.  Prior MRI brain from 2014 was normal, specifically, no evidence of demyelinating disease.  I explained that the most likely explanation for her multitude of symptoms and cognitive difficulty may be stemming from underlying mood disorder (anxiety/depression).  Likelihood of a primary neurological condition is very low.  To be sure, she wants to have updated MRI brain wwo contrast.  I have asked her to obtain neuropsychological testing results for my review.  I recommend that she continue to work with her psychiatrist to address anxiety/depression.   ------------------------------------------------------------- History of present illness: Starting in 2014, she began having problem with tingling on the right side of the face and head pressure as if someone's hand on top of her head.  MRI brain was normal.  In 2016, she felt that she was having difficulty retaining information such as when reading or watching TV.  She was also having tingling in the hands and arms.  NCS/EMG suggested mild ulnar neuropathy and she was told to avoid leaning on her elbows.  Sleep-deprived EEG was normal.  In the past year, she continues to have  difficulty retaining new information, making errors at work, confusion, word-finding difficulty and continues to have lightheadedness, and bitemporal pain. She sometimes feels like the floor is uneven.   In February 2024, she has neuropsychological testing but she does not have these results.     Out-side paper records, electronic medical record, and images have been reviewed where available and summarized as:  Lab Results  Component Value Date   HGBA1C 5.7 (H) 04/27/2022   Lab Results  Component Value Date   VITAMINB12 926 04/27/2022   Lab Results  Component Value Date   TSH 1.850 07/14/2022   No results found for: "ESRSEDRATE", "POCTSEDRATE"  Past Medical History:  Diagnosis Date   Anxiety    Asthma 05/06/2019   COVID     Past Surgical History:  Procedure Laterality Date   TONSILECTOMY/ADENOIDECTOMY WITH MYRINGOTOMY       Medications:  Outpatient Encounter Medications as of 08/31/2022  Medication Sig   albuterol (VENTOLIN HFA) 108 (90 Base) MCG/ACT inhaler Inhale 2 puffs into the lungs every 6 (six) hours as needed for wheezing or shortness of breath.   LORazepam (ATIVAN) 1 MG tablet Take by mouth as needed.   Multiple Vitamin (MULTI-VITAMIN) tablet Take 1 tablet by mouth daily.   RYALTRIS [id number] MCG/ACT SUSP as needed.   No facility-administered encounter medications on file as of 08/31/2022.    Allergies: No Known Allergies  Family History: Family History  Problem Relation Age of Onset   Hypertension Mother    Hypercholesterolemia Mother    Hypertension Father    Thyroid disease Father    COPD Maternal Grandmother    Diabetes Maternal Grandfather    Hypertension Maternal  Grandfather    Stroke Maternal Grandfather     Social History: Social History   Tobacco Use   Smoking status: Never   Smokeless tobacco: Never  Vaping Use   Vaping status: Never Used  Substance Use Topics   Alcohol use: Not Currently   Drug use: Not Currently   Social History    Social History Narrative   Are you right handed or left handed? Left   Are you currently employed ?    What is your current occupation? [employer]   Do you live at home alone?yes   Who lives with you?    What type of home do you live in: 1 story or 2 story? two    Caffeine none     Vital Signs:  BP 128/78   Pulse 93   Ht 5\' 8"  (1.727 m)   Wt 147 lb (66.7 kg)   SpO2 100%   BMI 22.35 kg/m    Neurological Exam: MENTAL STATUS including orientation to time, place, person, recent and remote memory, attention span and concentration, language, and fund of knowledge is normal.  Speech is pressured, not dysarthric.  CRANIAL NERVES: II:  No visual field defects.     III-IV-VI: Pupils equal round and reactive to light.  Normal conjugate, extra-ocular eye movements in all directions of gaze.  No nystagmus.  No ptosis.   V:  Normal facial sensation.    VII:  Normal facial symmetry and movements.   VIII:  Normal hearing and vestibular function.   IX-X:  Normal palatal movement.   XI:  Normal shoulder shrug and head rotation.   XII:  Normal tongue strength and range of motion, no deviation or fasciculation.  MOTOR:  Motor strength is 5/5 throughout. No atrophy, fasciculations or abnormal movements.  No pronator drift.   MSRs:                                           Right        Left brachioradialis 2+  2+  biceps 2+  2+  triceps 2+  2+  patellar 2+  2+  ankle jerk 2+  2+  plantar response down  down   SENSORY:  Normal and symmetric perception of light touch, pinprick, vibration, and temperature.  Romberg's sign absent.   COORDINATION/GAIT: Normal finger-to- nose-finger.  Intact rapid alternating movements bilaterally.  Able to rise from a chair without using arms.  Gait narrow based and stable. Tandem and stressed gait intact.    Thank you for allowing me to participate in patient's care.  If I can answer any additional questions, I would be pleased to do so.     Sincerely,    Miklos Bidinger K. Allena Katz, DO

## 2022-09-01 ENCOUNTER — Encounter: Payer: Self-pay | Admitting: Neurology

## 2022-09-01 NOTE — Progress Notes (Signed)
Fecal cal - normal

## 2022-09-08 ENCOUNTER — Telehealth: Payer: Self-pay

## 2022-09-08 NOTE — Telephone Encounter (Signed)
Received a sign medical records request for patient medical records from our office from 06/25/20 to present. The [employer]. Faxed records to [phone]. Got confirmation fax went through

## 2022-09-22 ENCOUNTER — Other Ambulatory Visit: Payer: Managed Care, Other (non HMO)

## 2022-09-23 ENCOUNTER — Telehealth: Payer: Self-pay | Admitting: Neurology

## 2022-09-23 NOTE — Telephone Encounter (Signed)
Message Taron for status of referral. Looks like PA is pending review.

## 2022-09-23 NOTE — Telephone Encounter (Signed)
Caller stated pt is still waiting on PA for brain MRI and pt would like an update

## 2022-09-28 ENCOUNTER — Ambulatory Visit: Payer: Managed Care, Other (non HMO) | Admitting: Gastroenterology

## 2022-09-30 ENCOUNTER — Ambulatory Visit: Payer: Managed Care, Other (non HMO) | Admitting: Family

## 2022-10-04 ENCOUNTER — Telehealth: Payer: Self-pay | Admitting: Cardiovascular Disease

## 2022-10-04 NOTE — Telephone Encounter (Signed)
Advised to go to the ED if pain reoccurs prior to appt on 9/10. Pt verbalized understanding and had no additional questions.

## 2022-10-04 NOTE — Telephone Encounter (Signed)
   Pt c/o of Chest Pain: STAT if active CP, including tightness, pressure, jaw pain, radiating pain to shoulder/upper arm/back, CP unrelieved by Nitro. Symptoms reported of SOB, nausea, vomiting, sweating.  1. Are you having CP right now? Chest pressure  and discomfortafter exertion- not at this tie    2. Are you experiencing any other symptoms (ex. SOB, nausea, vomiting, sweating)? She has shortness of breath when this happen and she says she has Asthma also   3. Is your CP continuous or coming and going? Comes and goes   4. Have you taken Nitroglycerin? no   5. How long have you been experiencing CP? Over a year- since she had Covid last year    6. If NO CP at time of call then end call with telling Pt to call back or call 911 if Chest pain returns prior to return call from triage team.

## 2022-10-04 NOTE — Telephone Encounter (Signed)
Left vm for pt to return call  

## 2022-10-05 ENCOUNTER — Ambulatory Visit: Payer: Managed Care, Other (non HMO) | Attending: Cardiology | Admitting: Cardiology

## 2022-10-05 ENCOUNTER — Encounter: Payer: Self-pay | Admitting: Cardiology

## 2022-10-05 ENCOUNTER — Ambulatory Visit: Payer: Managed Care, Other (non HMO) | Admitting: Family

## 2022-10-05 ENCOUNTER — Encounter: Payer: Self-pay | Admitting: Family

## 2022-10-05 VITALS — BP 126/72 | HR 72 | Ht 68.0 in | Wt 151.0 lb

## 2022-10-05 VITALS — BP 121/70 | HR 85 | Ht 68.0 in | Wt 152.6 lb

## 2022-10-05 DIAGNOSIS — R5383 Other fatigue: Secondary | ICD-10-CM

## 2022-10-05 DIAGNOSIS — R002 Palpitations: Secondary | ICD-10-CM

## 2022-10-05 DIAGNOSIS — R0789 Other chest pain: Secondary | ICD-10-CM | POA: Diagnosis not present

## 2022-10-05 DIAGNOSIS — J4550 Severe persistent asthma, uncomplicated: Secondary | ICD-10-CM

## 2022-10-05 DIAGNOSIS — E611 Iron deficiency: Secondary | ICD-10-CM

## 2022-10-05 DIAGNOSIS — I10 Essential (primary) hypertension: Secondary | ICD-10-CM | POA: Diagnosis not present

## 2022-10-05 DIAGNOSIS — E538 Deficiency of other specified B group vitamins: Secondary | ICD-10-CM | POA: Diagnosis not present

## 2022-10-05 DIAGNOSIS — I479 Paroxysmal tachycardia, unspecified: Secondary | ICD-10-CM

## 2022-10-05 DIAGNOSIS — E782 Mixed hyperlipidemia: Secondary | ICD-10-CM

## 2022-10-05 DIAGNOSIS — E559 Vitamin D deficiency, unspecified: Secondary | ICD-10-CM | POA: Diagnosis not present

## 2022-10-05 DIAGNOSIS — J45909 Unspecified asthma, uncomplicated: Secondary | ICD-10-CM

## 2022-10-05 DIAGNOSIS — Z8639 Personal history of other endocrine, nutritional and metabolic disease: Secondary | ICD-10-CM

## 2022-10-05 DIAGNOSIS — F419 Anxiety disorder, unspecified: Secondary | ICD-10-CM | POA: Diagnosis not present

## 2022-10-05 DIAGNOSIS — R252 Cramp and spasm: Secondary | ICD-10-CM

## 2022-10-05 NOTE — Progress Notes (Signed)
Cardiology Office Note:  .   Date:  10/05/2022  ID:  Cynthia Mcintosh, DOB 1985-12-10, MRN [id number] PCP: Miki Kins, FNP  [facility] Providers Cardiologist:  None    History of Present Illness: .   Cynthia Mcintosh is a 37 y.o. female with a past medical history of palpitations, paroxysmal tachycardia, anxiety, moderate depressive disorder, atypical chest tightness, asthma, anxiety, and panic attacks dating back from 2012, who is here today for follow-up.  Was last seen in clinic 10/14/2021 by Dr. Mariah Milling with complaints of shortness of breath, chest discomfort/chest pressure and palpitations.  She previously suffered from a COVID infection in January 2022.  She also reports she was off of all of her antianxiety medications and that is when she started having increased frequency of tachycardia and chest tightness.  She was scheduled for an echocardiogram and heart monitor.  She returns clinic today stating that she has been experiencing chest tightness and chest pressure with primarily exertion.  She states that the discomfort that she has as it usually is substernal but can radiate into her bilateral axillary areas and has occasional associated shortness of breath and palpitations. she continues to be very active getting in over 10,000 steps per day.  She has had previous instances of chest discomfort, shortness of breath, and palpitations.  She has been more concerned since finding out more family history as she had an uncle who continues to be in the [place] that had to have coronary stents and aorta stenting completed.  She states that previously she had all of the symptoms that improved with iron supplements and is going over her blood work to recheck her levels.  She denies any recent hospitalizations or visits to the emergency department.  ROS: 10 point review of systems has been completed and considered negative the exception was been listed in the HPI  Studies Reviewed: Marland Kitchen   EKG  Interpretation Date/Time:  Tuesday October 05 2022 09:05:19 EDT Ventricular Rate:  72 PR Interval:  150 QRS Duration:  66 QT Interval:  374 QTC Calculation: 409 R Axis:   43  Text Interpretation: Normal sinus rhythm When compared with ECG of 23-Jan-2022 19:00, Nonspecific T wave abnormality no longer evident in Lateral leads Confirmed by Charlsie Quest ([id number]) on 10/05/2022 9:11:41 AM   TTE 11/28/20 1. Left ventricular ejection fraction, by estimation, is 60 to 65%. The  left ventricle has normal function. The left ventricle has no regional  wall motion abnormalities. Left ventricular diastolic parameters were  normal. The average left ventricular  global longitudinal strain is -20.1 %. The global longitudinal strain is  normal.   2. Right ventricular systolic function is normal. The right ventricular  size is normal. There is normal pulmonary artery systolic pressure. The  estimated right ventricular systolic pressure is 17.5 mmHg.   3. The mitral valve is normal in structure. Mild mitral valve  regurgitation. No evidence of mitral stenosis.   4. The aortic valve is normal in structure. Aortic valve regurgitation is  not visualized. No aortic stenosis is present.   5. The inferior vena cava is normal in size with greater than 50%  respiratory variability, suggesting right atrial pressure of 3 mmHg.   Heart Monitor 11/24/20 Normal sinus rhythm Patient had a min HR of 51 bpm, max HR of 189 bpm, and avg HR of 84 bpm.    3 Supraventricular Tachycardia runs occurred, the run with the fastest interval lasting 5 beats with a max rate of 179  bpm, the longest lasting 17 beats with an avg rate of 127 bpm.    Isolated SVEs were rare (<1.0%), SVE Couplets were rare (<1.0%), and SVE Triplets were rare (<1.0%).  Isolated VEs were rare (<1.0%), VE Couplets were rare (<1.0%), and no VE Triplets were present.    Patient triggered events not associated with significant arrhythmia   Risk  Assessment/Calculations:             Physical Exam:   VS:  BP 126/72 (BP Location: Left Arm, Patient Position: Sitting, Cuff Size: Normal)   Pulse 72   Ht 5\' 8"  (1.727 m)   Wt 151 lb (68.5 kg)   SpO2 100%   BMI 22.96 kg/m    Wt Readings from Last 3 Encounters:  10/05/22 151 lb (68.5 kg)  08/31/22 147 lb (66.7 kg)  08/16/22 145 lb 6 oz (65.9 kg)    GEN: Well nourished, well developed in no acute distress NECK: No JVD; No carotid bruits CARDIAC: RRR, no murmurs, rubs, gallops RESPIRATORY:  Clear to auscultation without rales, wheezing or rhonchi  ABDOMEN: Soft, non-tender, non-distended EXTREMITIES:  No edema; No deformity   ASSESSMENT AND PLAN: .   Atypical chest pressure and chest tightness patient experiences during exertion and sometimes at rest.  She also has associated symptoms of shortness of breath and palpitations.  Recently had had an echocardiogram that revealed an LVEF of 60 to 65%, no regional wall motion abnormalities, mild mitral valve regurgitation.  With her being as active as she is she has been scheduled for an exercise tolerance test.  We also discussed coronary CTA and have opted for treadmill testing at this time due to her age and active lifestyle.  Paroxysmal tachycardia with a last heart monitor completed in 10/2020 with no significant arrhythmia noted.  EKG today reveals sinus rhythm with a rate of 72 with no acute change from prior studies.  Asthma where she has a rescue inhaler she continues to follow with pulmonary and has upcoming testing to determine the extent of her asthma or if it is just scarring from having COVID on 2 separate occasions.  Anxiety where she has not previously on medication and was managed by psychiatry.  She reports that she is no longer on any medication at this time.  History of iron deficiency which she had previously been on iron supplements.  She is no longer taking supplements and is requesting labs today to recheck iron to  ensure that iron deficiency is not causing her symptoms.    Informed Consent   Shared Decision Making/Informed Consent The risks [chest pain, shortness of breath, cardiac arrhythmias, dizziness, blood pressure fluctuations, myocardial infarction, stroke/transient ischemic attack, and life-threatening complications (estimated to be 1 in 10,000)], benefits (risk stratification, diagnosing coronary artery disease, treatment guidance) and alternatives of an exercise tolerance test were discussed in detail with Ms. Harps and she agrees to proceed.     Dispo: Patient to return to clinic to see MD/APP once testing is completed or sooner if needed  Signed, Eryka Dolinger, NP

## 2022-10-05 NOTE — Patient Instructions (Addendum)
Medication Instructions:  No changes at this time.   *If you need a refill on your cardiac medications before your next appointment, please call your pharmacy*   Lab Work: None  If you have labs (blood work) drawn today and your tests are completely normal, you will receive your results only by: MyChart Message (if you have MyChart) OR A paper copy in the mail If you have any lab test that is abnormal or we need to change your treatment, we will call you to review the results.   Testing/Procedures: - you may eat a light breakfast/ lunch prior to your procedure - no caffeine for 24 hours prior to your test (coffee, tea, soft drinks, or chocolate)  - no smoking/ vaping for 4 hours prior to your test - you may take your regular medications the day of your test  - bring any inhalers with you to your test - wear comfortable clothing & tennis/ non-skid shoes to walk on the treadmill   Follow-Up: At [facility], you and your health needs are our priority.  As part of our continuing mission to provide you with exceptional heart care, we have created designated Provider Care Teams.  These Care Teams include your primary Cardiologist (physician) and Advanced Practice Providers (APPs -  Physician Assistants and Nurse Practitioners) who all work together to provide you with the care you need, when you need it.   Your next appointment:   4 week(s)  Provider:   Julien Nordmann, MD or Charlsie Quest, NP

## 2022-10-06 ENCOUNTER — Encounter: Payer: Self-pay | Admitting: Family

## 2022-10-06 LAB — CMP14+EGFR
ALT: 14 IU/L (ref 0–32)
AST: 20 IU/L (ref 0–40)
Albumin: 4.3 g/dL (ref 3.9–4.9)
Alkaline Phosphatase: 56 IU/L (ref 44–121)
BUN/Creatinine Ratio: 18 (ref 9–23)
BUN: 14 mg/dL (ref 6–20)
Bilirubin Total: <0.2 mg/dL (ref 0.0–1.2)
CO2: 22 mmol/L (ref 20–29)
Calcium: 9.1 mg/dL (ref 8.7–10.2)
Chloride: 104 mmol/L (ref 96–106)
Creatinine, Ser: 0.8 mg/dL (ref 0.57–1.00)
Globulin, Total: 3 g/dL (ref 1.5–4.5)
Glucose: 67 mg/dL — ABNORMAL LOW (ref 70–99)
Potassium: 4 mmol/L (ref 3.5–5.2)
Sodium: 139 mmol/L (ref 134–144)
Total Protein: 7.3 g/dL (ref 6.0–8.5)
eGFR: 97 mL/min/{1.73_m2} (ref >59)

## 2022-10-06 LAB — IRON,TIBC AND FERRITIN PANEL
Ferritin: 45 ng/mL (ref 15–150)
Iron Saturation: 16 % (ref 15–55)
Iron: 56 ug/dL (ref 27–159)
Total Iron Binding Capacity: 353 ug/dL (ref 250–450)
UIBC: 297 ug/dL (ref 131–425)

## 2022-10-06 LAB — CBC WITH DIFF/PLATELET
Basophils Absolute: 0.1 10*3/uL (ref 0.0–0.2)
Basos: 1 %
EOS (ABSOLUTE): 0.1 10*3/uL (ref 0.0–0.4)
Eos: 1 %
Hematocrit: 41.4 % (ref 34.0–46.6)
Hemoglobin: 13.2 g/dL (ref 11.1–15.9)
Immature Grans (Abs): 0 10*3/uL (ref 0.0–0.1)
Immature Granulocytes: 0 %
Lymphocytes Absolute: 3 10*3/uL (ref 0.7–3.1)
Lymphs: 34 %
MCH: 31 pg (ref 26.6–33.0)
MCHC: 31.9 g/dL (ref 31.5–35.7)
MCV: 97 fL (ref 79–97)
Monocytes Absolute: 0.7 10*3/uL (ref 0.1–0.9)
Monocytes: 8 %
Neutrophils Absolute: 5 10*3/uL (ref 1.4–7.0)
Neutrophils: 56 %
Platelets: 212 10*3/uL (ref 150–450)
RBC: 4.26 x10E6/uL (ref 3.77–5.28)
RDW: 12.3 % (ref 11.7–15.4)
WBC: 8.8 10*3/uL (ref 3.4–10.8)

## 2022-10-06 LAB — VITAMIN D 25 HYDROXY (VIT D DEFICIENCY, FRACTURES): Vit D, 25-Hydroxy: 28.2 ng/mL — ABNORMAL LOW (ref 30.0–100.0)

## 2022-10-06 LAB — PHOSPHORUS: Phosphorus: 3 mg/dL (ref 3.0–4.3)

## 2022-10-06 LAB — MAGNESIUM: Magnesium: 2 mg/dL (ref 1.6–2.3)

## 2022-10-06 LAB — VITAMIN B12: Vitamin B-12: 738 pg/mL (ref 232–1245)

## 2022-10-14 ENCOUNTER — Ambulatory Visit
Admission: RE | Admit: 2022-10-14 | Discharge: 2022-10-14 | Disposition: A | Discharge status: Home / Self Care | Payer: Managed Care, Other (non HMO) | Source: Ambulatory Visit | Attending: Cardiology | Admitting: Cardiology

## 2022-10-14 DIAGNOSIS — R0789 Other chest pain: Secondary | ICD-10-CM

## 2022-10-15 LAB — EXERCISE TOLERANCE TEST
Estimated workload: 7
Exercise duration (min): 5 min
Exercise duration (sec): 1 s
Peak HR: 181 {beats}/min
Percent HR: 183 %
ST Depression (mm): 0 mm

## 2022-10-15 NOTE — Progress Notes (Signed)
Normal exercise treadmill study.  No significant EKG changes noted and transported recovery concerning for ischemia.  Overall reassuring study.

## 2022-10-19 ENCOUNTER — Ambulatory Visit
Admission: RE | Admit: 2022-10-19 | Discharge: 2022-10-19 | Disposition: A | Discharge status: Home / Self Care | Payer: Managed Care, Other (non HMO) | Source: Ambulatory Visit | Attending: Neurology | Admitting: Neurology

## 2022-10-19 DIAGNOSIS — R413 Other amnesia: Secondary | ICD-10-CM

## 2022-10-19 DIAGNOSIS — R202 Paresthesia of skin: Secondary | ICD-10-CM

## 2022-10-19 MED ORDER — GADOPICLENOL 0.5 MMOL/ML IV SOLN: 7.5000 mL | Freq: Once | INTRAVENOUS | Status: DC | PRN

## 2022-10-24 ENCOUNTER — Encounter: Payer: Self-pay | Admitting: Family

## 2022-10-24 DIAGNOSIS — F321 Major depressive disorder, single episode, moderate: Secondary | ICD-10-CM | POA: Insufficient documentation

## 2022-10-24 HISTORY — DX: Major depressive disorder, single episode, moderate: F32.1

## 2022-10-24 NOTE — Progress Notes (Signed)
Established Patient Office Visit  Subjective:  Patient ID: Cynthia Mcintosh, female    DOB: 1985/11/22  Age: 37 y.o. MRN: [id number]  No chief complaint on file.   Patient is here for follow up.  She does have a visit scheduled with her functional medicine person soon.  She is feeling well in general, but she would still like some of her additional labs checked again.    No other concerns at this time.   Past Medical History:  Diagnosis Date   Anxiety    Asthma 05/06/2019   COVID    Major depressive disorder, single episode, moderate (HCC) 10/24/2022    Past Surgical History:  Procedure Laterality Date   TONSILECTOMY/ADENOIDECTOMY WITH MYRINGOTOMY      Social History   Socioeconomic History   Marital status: Single    Spouse name: Not on file   Number of children: Not on file   Years of education: Not on file   Highest education level: Not on file  Occupational History   Not on file  Tobacco Use   Smoking status: Never   Smokeless tobacco: Never  Vaping Use   Vaping status: Never Used  Substance and Sexual Activity   Alcohol use: Not Currently   Drug use: Not Currently   Sexual activity: Yes    Birth control/protection: Pill  Other Topics Concern   Not on file  Social History Narrative   Are you right handed or left handed? Left   Are you currently employed ?    What is your current occupation? [employer]   Do you live at home alone?yes   Who lives with you?    What type of home do you live in: 1 story or 2 story? two    Caffeine none    Social Determinants of Health   Financial Resource Strain: Low Risk  (07/10/2021)   Received from [facility], [facility]   Overall Financial Resource Strain (CARDIA)    Difficulty of Paying Living Expenses: Not hard at all  Food Insecurity: No Food Insecurity (07/10/2021)   Received from [facility], [facility]   Hunger Vital Sign    Worried About Running Out of Food in the Last Year: Never true    Ran  Out of Food in the Last Year: Never true  Transportation Needs: No Transportation Needs (07/10/2021)   Received from [facility], [facility]   [facility] - Transportation    Lack of Transportation (Medical): No    Lack of Transportation (Non-Medical): No  Physical Activity: Sufficiently Active (07/10/2021)   Received from [facility], [facility]   Exercise Vital Sign    Days of Exercise per Week: 5 days    Minutes of Exercise per Session: 30 min  Stress: Stress Concern Present (07/10/2021)   Received from [facility], [facility] Occupational [facility] - Occupational Stress Questionnaire    Feeling of Stress : Rather much  Social Connections: Moderately Isolated (07/10/2021)   Received from [facility], [facility]   Social Connection and Isolation Panel [NHANES]    Frequency of Communication with Friends and Family: More than three times a week    Frequency of Social Gatherings with Friends and Family: More than three times a week    Attends Religious Services: 1 to 4 times per year    Active Member of Clubs or Organizations: No    Attends  Club or Organization Meetings: Never    Marital Status: Never married  Intimate Partner Violence: Not At Risk (07/10/2021)   Received from [facility], [facility]   Humiliation, Afraid, Rape, and Kick questionnaire    Fear of Current or Ex-Partner: No    Emotionally Abused: No    Physically Abused: No    Sexually Abused: No    Family History  Problem Relation Age of Onset   Hypertension Mother    Hypercholesterolemia Mother    Hypertension Father    Thyroid disease Father    COPD Maternal Grandmother    Diabetes Maternal Grandfather    Hypertension Maternal Grandfather    Stroke Maternal Grandfather     No Known Allergies  Review of Systems  Constitutional:  Positive for malaise/fatigue.  All other systems reviewed and are negative.      Objective:   BP 121/70   Pulse  85   Ht 5\' 8"  (1.727 m)   Wt 152 lb 9.6 oz (69.2 kg)   LMP 09/18/2022   SpO2 99%   BMI 23.20 kg/m   Vitals:   10/05/22 1423  BP: 121/70  Pulse: 85  Height: 5\' 8"  (1.727 m)  Weight: 152 lb 9.6 oz (69.2 kg)  SpO2: 99%  BMI (Calculated): 23.21    Physical Exam Vitals and nursing note reviewed.  Constitutional:      Appearance: Normal appearance. She is normal weight.  HENT:     Head: Normocephalic.  Eyes:     Pupils: Pupils are equal, round, and reactive to light.  Cardiovascular:     Rate and Rhythm: Normal rate.  Pulmonary:     Effort: Pulmonary effort is normal.  Neurological:     General: No focal deficit present.     Mental Status: She is alert and oriented to person, place, and time. Mental status is at baseline.  Psychiatric:        Mood and Affect: Mood normal.        Behavior: Behavior normal.        Thought Content: Thought content normal.        Judgment: Judgment normal.      Results for orders placed or performed in visit on 10/05/22  Iron, TIBC and Ferritin Panel  Result Value Ref Range   Total Iron Binding Capacity 353 250 - 450 ug/dL   UIBC 161 096 - 045 ug/dL   Iron 56 27 - 409 ug/dL   Iron Saturation 16 15 - 55 %   Ferritin 45 15 - 150 ng/mL  Magnesium  Result Value Ref Range   Magnesium 2.0 1.6 - 2.3 mg/dL  Vitamin D (25 hydroxy)  Result Value Ref Range   Vit D, 25-Hydroxy 28.2 (L) 30.0 - 100.0 ng/mL  CMP14+EGFR  Result Value Ref Range   Glucose 67 (L) 70 - 99 mg/dL   BUN 14 6 - 20 mg/dL   Creatinine, Ser 8.11 0.57 - 1.00 mg/dL   eGFR 97 >91 YN/WGN/5.62   BUN/Creatinine Ratio 18 9 - 23   Sodium 139 134 - 144 mmol/L   Potassium 4.0 3.5 - 5.2 mmol/L   Chloride 104 96 - 106 mmol/L   CO2 22 20 - 29 mmol/L   Calcium 9.1 8.7 - 10.2 mg/dL   Total Protein 7.3 6.0 - 8.5 g/dL   Albumin 4.3 3.9 - 4.9 g/dL   Globulin, Total 3.0 1.5 - 4.5 g/dL   Bilirubin Total <1.3 0.0 - 1.2 mg/dL  Alkaline Phosphatase 56 44 - 121 IU/L   AST 20 0 - 40 IU/L    ALT 14 0 - 32 IU/L  Vitamin B12  Result Value Ref Range   Vitamin B-12 738 232 - 1,245 pg/mL  CBC With Diff/Platelet  Result Value Ref Range   WBC 8.8 3.4 - 10.8 x10E3/uL   RBC 4.26 3.77 - 5.28 x10E6/uL   Hemoglobin 13.2 11.1 - 15.9 g/dL   Hematocrit 91.4 78.2 - 46.6 %   MCV 97 79 - 97 fL   MCH 31.0 26.6 - 33.0 pg   MCHC 31.9 31.5 - 35.7 g/dL   RDW 95.6 21.3 - 08.6 %   Platelets 212 150 - 450 x10E3/uL   Neutrophils 56 Not Estab. %   Lymphs 34 Not Estab. %   Monocytes 8 Not Estab. %   Eos 1 Not Estab. %   Basos 1 Not Estab. %   Neutrophils Absolute 5.0 1.4 - 7.0 x10E3/uL   Lymphocytes Absolute 3.0 0.7 - 3.1 x10E3/uL   Monocytes Absolute 0.7 0.1 - 0.9 x10E3/uL   EOS (ABSOLUTE) 0.1 0.0 - 0.4 x10E3/uL   Basophils Absolute 0.1 0.0 - 0.2 x10E3/uL   Immature Granulocytes 0 Not Estab. %   Immature Grans (Abs) 0.0 0.0 - 0.1 x10E3/uL  Phosphorus  Result Value Ref Range   Phosphorus 3.0 3.0 - 4.3 mg/dL    Recent Results (from the past 2160 hour(s))  Calprotectin, Fecal     Status: None   Collection Time: 08/26/22 12:00 AM   Specimen: Stool   Stool  Result Value Ref Range   Calprotectin, Fecal 65 0 - 120 ug/g    Comment: Concentration     Interpretation   Follow-Up < 5 - 50 ug/g     Normal           None >50 -120 ug/g     Borderline       Re-evaluate in 4-6 weeks     >120 ug/g     Abnormal         Repeat as clinically                                    indicated   Specimen status report     Status: None   Collection Time: 08/26/22 12:00 AM  Result Value Ref Range   specimen status report Comment     Comment: Please note Please note The date and/or time of collection was not indicated on the requisition as required by state and federal law.  The date of receipt of the specimen was used as the collection date if not supplied.   H. pylori antigen, stool     Status: None   Collection Time: 09/16/22 10:50 AM  Result Value Ref Range   H pylori Ag, Stl Negative Negative   Iron, TIBC and Ferritin Panel     Status: None   Collection Time: 10/05/22  2:53 PM  Result Value Ref Range   Total Iron Binding Capacity 353 250 - 450 ug/dL   UIBC 578 469 - 629 ug/dL   Iron 56 27 - 528 ug/dL   Iron Saturation 16 15 - 55 %   Ferritin 45 15 - 150 ng/mL  Magnesium     Status: None   Collection Time: 10/05/22  2:53 PM  Result Value Ref Range   Magnesium 2.0 1.6 - 2.3 mg/dL  Vitamin D (  25 hydroxy)     Status: Abnormal   Collection Time: 10/05/22  2:53 PM  Result Value Ref Range   Vit D, 25-Hydroxy 28.2 (L) 30.0 - 100.0 ng/mL    Comment: Vitamin D deficiency has been defined by the Institute of Medicine and an Endocrine Society practice guideline as a level of serum 25-OH vitamin D less than 20 ng/mL (1,2). The Endocrine Society went on to further define vitamin D insufficiency as a level between 21 and 29 ng/mL (2). 1. IOM (Institute of Medicine). 2010. Dietary reference    intakes for calcium and D. [place]: The    [employer]. 2. Holick MF, Binkley [place], Bischoff-Ferrari HA, et al.    Evaluation, treatment, and prevention of vitamin D    deficiency: an Endocrine Society clinical practice    guideline. JCEM. 2011 Jul; 96(7):1911-30.   CMP14+EGFR     Status: Abnormal   Collection Time: 10/05/22  2:53 PM  Result Value Ref Range   Glucose 67 (L) 70 - 99 mg/dL   BUN 14 6 - 20 mg/dL   Creatinine, Ser 1.61 0.57 - 1.00 mg/dL   eGFR 97 >09 UE/AVW/0.98   BUN/Creatinine Ratio 18 9 - 23   Sodium 139 134 - 144 mmol/L   Potassium 4.0 3.5 - 5.2 mmol/L   Chloride 104 96 - 106 mmol/L   CO2 22 20 - 29 mmol/L   Calcium 9.1 8.7 - 10.2 mg/dL   Total Protein 7.3 6.0 - 8.5 g/dL   Albumin 4.3 3.9 - 4.9 g/dL   Globulin, Total 3.0 1.5 - 4.5 g/dL   Bilirubin Total <1.1 0.0 - 1.2 mg/dL   Alkaline Phosphatase 56 44 - 121 IU/L   AST 20 0 - 40 IU/L   ALT 14 0 - 32 IU/L  Vitamin B12     Status: None   Collection Time: 10/05/22  2:53 PM  Result Value Ref Range    Vitamin B-12 738 232 - 1,245 pg/mL  CBC With Diff/Platelet     Status: None   Collection Time: 10/05/22  2:53 PM  Result Value Ref Range   WBC 8.8 3.4 - 10.8 x10E3/uL   RBC 4.26 3.77 - 5.28 x10E6/uL   Hemoglobin 13.2 11.1 - 15.9 g/dL   Hematocrit 91.4 78.2 - 46.6 %   MCV 97 79 - 97 fL   MCH 31.0 26.6 - 33.0 pg   MCHC 31.9 31.5 - 35.7 g/dL   RDW 95.6 21.3 - 08.6 %   Platelets 212 150 - 450 x10E3/uL   Neutrophils 56 Not Estab. %   Lymphs 34 Not Estab. %   Monocytes 8 Not Estab. %   Eos 1 Not Estab. %   Basos 1 Not Estab. %   Neutrophils Absolute 5.0 1.4 - 7.0 x10E3/uL   Lymphocytes Absolute 3.0 0.7 - 3.1 x10E3/uL   Monocytes Absolute 0.7 0.1 - 0.9 x10E3/uL   EOS (ABSOLUTE) 0.1 0.0 - 0.4 x10E3/uL   Basophils Absolute 0.1 0.0 - 0.2 x10E3/uL   Immature Granulocytes 0 Not Estab. %   Immature Grans (Abs) 0.0 0.0 - 0.1 x10E3/uL  Phosphorus     Status: None   Collection Time: 10/05/22  2:53 PM  Result Value Ref Range   Phosphorus 3.0 3.0 - 4.3 mg/dL  Exercise Tolerance Test     Status: None   Collection Time: 10/15/22  2:23 PM  Result Value Ref Range   Exercise duration (min) 5 min   Exercise duration (sec) 1 sec  Estimated workload 7.0    Peak HR 181 bpm   Percent HR 183.0 %   ST Depression (mm) 0 mm       Assessment & Plan:   Problem List Items Addressed This Visit       Active Problems   Severe persistent asthma without complication   Other Visit Diagnoses     Other fatigue    -  Primary   Relevant Orders   CMP14+EGFR (Completed)   CBC With Diff/Platelet (Completed)   Phosphorus (Completed)   B12 deficiency due to diet       Relevant Orders   CMP14+EGFR (Completed)   Vitamin B12 (Completed)   CBC With Diff/Platelet (Completed)   Phosphorus (Completed)   Vitamin D deficiency, unspecified       Relevant Orders   Vitamin D (25 hydroxy) (Completed)   CMP14+EGFR (Completed)   CBC With Diff/Platelet (Completed)   Phosphorus (Completed)   Mixed hyperlipidemia        Relevant Orders   CMP14+EGFR (Completed)   CBC With Diff/Platelet (Completed)   Phosphorus (Completed)   Essential hypertension, benign       Relevant Orders   CMP14+EGFR (Completed)   CBC With Diff/Platelet (Completed)   Phosphorus (Completed)   Iron deficiency       Relevant Orders   Iron, TIBC and Ferritin Panel (Completed)   CMP14+EGFR (Completed)   CBC With Diff/Platelet (Completed)   Phosphorus (Completed)   Leg cramps       Relevant Orders   Magnesium (Completed)   CMP14+EGFR (Completed)   CBC With Diff/Platelet (Completed)   Phosphorus (Completed)      Labs today - will contact with results.   Return in about 3 months (around 01/04/2023).   Total time spent: 20 minutes  Miki Kins, FNP  10/05/2022   This document may have been prepared by [facility] Voice Recognition software and as such may include unintentional dictation errors.

## 2022-10-27 ENCOUNTER — Encounter: Payer: Self-pay | Admitting: Obstetrics and Gynecology

## 2022-10-27 DIAGNOSIS — F419 Anxiety disorder, unspecified: Secondary | ICD-10-CM

## 2022-10-27 DIAGNOSIS — Z13220 Encounter for screening for lipoid disorders: Secondary | ICD-10-CM

## 2022-10-27 DIAGNOSIS — Z113 Encounter for screening for infections with a predominantly sexual mode of transmission: Secondary | ICD-10-CM

## 2022-10-27 DIAGNOSIS — R7303 Prediabetes: Secondary | ICD-10-CM

## 2022-10-27 DIAGNOSIS — Z131 Encounter for screening for diabetes mellitus: Secondary | ICD-10-CM

## 2022-10-27 DIAGNOSIS — Z01419 Encounter for gynecological examination (general) (routine) without abnormal findings: Secondary | ICD-10-CM

## 2022-10-27 NOTE — Addendum Note (Signed)
Addended by: Tommie Raymond on: 10/27/2022 03:41 PM   Modules accepted: Orders

## 2022-10-28 NOTE — Telephone Encounter (Signed)
I did go back and look at previous orders and put them in and I put the orders as Future for the week before her scheduled appointment. I asked her to come in on Nov. 4 her annual exam is scheduled for Nov.13, but she wants them done now.

## 2022-10-29 NOTE — Addendum Note (Signed)
Addended by: Tommie Raymond on: 10/29/2022 03:13 PM   Modules accepted: Orders

## 2022-11-04 ENCOUNTER — Encounter: Payer: Self-pay | Admitting: Obstetrics

## 2022-11-04 ENCOUNTER — Ambulatory Visit: Payer: Managed Care, Other (non HMO) | Attending: Cardiology | Admitting: Cardiology

## 2022-11-04 ENCOUNTER — Ambulatory Visit: Payer: Managed Care, Other (non HMO) | Admitting: Obstetrics

## 2022-11-04 ENCOUNTER — Encounter: Payer: Self-pay | Admitting: Cardiology

## 2022-11-04 VITALS — BP 121/71 | HR 86 | Ht 67.5 in | Wt 153.2 lb

## 2022-11-04 VITALS — BP 123/77 | HR 91 | Ht 67.0 in | Wt 152.0 lb

## 2022-11-04 DIAGNOSIS — Z113 Encounter for screening for infections with a predominantly sexual mode of transmission: Secondary | ICD-10-CM

## 2022-11-04 DIAGNOSIS — I479 Paroxysmal tachycardia, unspecified: Secondary | ICD-10-CM

## 2022-11-04 DIAGNOSIS — Z87898 Personal history of other specified conditions: Secondary | ICD-10-CM | POA: Diagnosis not present

## 2022-11-04 DIAGNOSIS — F419 Anxiety disorder, unspecified: Secondary | ICD-10-CM

## 2022-11-04 DIAGNOSIS — Z8639 Personal history of other endocrine, nutritional and metabolic disease: Secondary | ICD-10-CM

## 2022-11-04 DIAGNOSIS — N898 Other specified noninflammatory disorders of vagina: Secondary | ICD-10-CM | POA: Diagnosis not present

## 2022-11-04 DIAGNOSIS — J45909 Unspecified asthma, uncomplicated: Secondary | ICD-10-CM

## 2022-11-04 NOTE — Progress Notes (Signed)
Cardiology Office Note:  .   Date:  11/04/2022  ID:  Cynthia Mcintosh, DOB 25-Dec-1985, MRN [id number] PCP: Miki Kins, FNP  [facility] Providers Cardiologist:  None    History of Present Illness: .   Cynthia Mcintosh is a 37 y.o. female with past medical history of palpitations, paroxysmal tachycardia, anxiety, moderate depression disorder, atypical chest tightness, asthma, anxiety, panic attacks dating back from 2012, who is here today for follow-up after recent exercise tolerance testing.  She was last seen in clinic 99/10/20 for STEMI treatments.  Chest tightness chest pressure primarily with exertion.  She states that the discomfort was usually substernal but can radiate into her bilateral axillary areas and occasionally associated with shortness of breath and palpitations.  With her age and history we discussed coronary CTA as well as exercise tolerance test and she opted to do treadmill testing.  She returns to clinic today stating that she has been doing well. She denies any reoccurrence of chest tightness of shortness of breath. She recently had labs done by her PCP and noted a significant decrease in her iron levels. Since starting an iron supplement her symptoms have resolved. She has also stared on a multivitamin. She had a long discussion with her PCP and the potential for long COVID was determined to be an option for her symptoms. She continues to have fatigue Denies any hospitalizations or visits to the emergency department.  ROS: 10 point review of systems has been reviewed and considered negative except what is been listed in the HPI  Studies Reviewed: Marland Kitchen       Exercise tolerance test 10/15/2022 Exercise treadmill study Regular Bruce protocol Resting EKG with normal sinus rhythm rate 110 bpm blood pressure 105/87 Exercised for 5 minutes, achieved peak heart rate 181 bpm, 98% of maximum predicted heart rate Peak blood pressure 136/92 Achieved 7.0 METS No significant ST  changes at peak stress or in recovery concerning for ischemia Heart rate down to 126 bpm at 4 minutes in recovery   Final impression Normal exercise treadmill study Sinus tachycardia at rest   TTE 11/28/20 1. Left ventricular ejection fraction, by estimation, is 60 to 65%. The  left ventricle has normal function. The left ventricle has no regional  wall motion abnormalities. Left ventricular diastolic parameters were  normal. The average left ventricular  global longitudinal strain is -20.1 %. The global longitudinal strain is  normal.   2. Right ventricular systolic function is normal. The right ventricular  size is normal. There is normal pulmonary artery systolic pressure. The  estimated right ventricular systolic pressure is 17.5 mmHg.   3. The mitral valve is normal in structure. Mild mitral valve  regurgitation. No evidence of mitral stenosis.   4. The aortic valve is normal in structure. Aortic valve regurgitation is  not visualized. No aortic stenosis is present.   5. The inferior vena cava is normal in size with greater than 50%  respiratory variability, suggesting right atrial pressure of 3 mmHg.    Heart Monitor 11/24/20 Normal sinus rhythm Patient had a min HR of 51 bpm, max HR of 189 bpm, and avg HR of 84 bpm.    3 Supraventricular Tachycardia runs occurred, the run with the fastest interval lasting 5 beats with a max rate of 179 bpm, the longest lasting 17 beats with an avg rate of 127 bpm.    Isolated SVEs were rare (<1.0%), SVE Couplets were rare (<1.0%), and SVE Triplets were rare (<1.0%).  Isolated  VEs were rare (<1.0%), VE Couplets were rare (<1.0%), and no VE Triplets were present.    Patient triggered events not associated with significant arrhythmia Risk Assessment/Calculations:             Physical Exam:   VS:  BP 121/71 (BP Location: Left Arm, Patient Position: Sitting, Cuff Size: Normal)   Pulse 86   Ht 5' 7.5" (1.715 m)   Wt 153 lb 3.2 oz (69.5 kg)    LMP 09/18/2022   SpO2 99%   BMI 23.64 kg/m    Wt Readings from Last 3 Encounters:  11/04/22 153 lb 3.2 oz (69.5 kg)  10/05/22 152 lb 9.6 oz (69.2 kg)  10/05/22 151 lb (68.5 kg)    GEN: Well nourished, well developed in no acute distress NECK: No JVD; No carotid bruits CARDIAC: RRR, no murmurs, rubs, gallops RESPIRATORY:  Clear to auscultation without rales, wheezing or rhonchi  ABDOMEN: Soft, non-tender, non-distended EXTREMITIES:  No edema; No deformity   ASSESSMENT AND PLAN: .   Atypical chest pain that is now resolved.  Recent exercise tolerance test was completed with her normal exercise treadmill study with some sinus tachycardia noted.  With restarting iron supplements her symptoms have now resolved.  With 5 minutes on the treadmill during exercise tolerance test she has increasing her activity to improve some likely deconditioning.  Paroxysmal tachycardia noted on labs are marked completed 10/2020.  She also had tachycardia noted during stress testing.  Resting heart rates remain in the 70s and 80s.  With a history of paroxysmal tachycardia and longstanding history of asthma will defer adding beta-blocker therapy today.  Longstanding history of asthma where she continues to have a rescue inhaler.  She just followed up with her PCP as well and they have discussed a large quantity of her symptoms may be related to long COVID.  Anxiety where she was previously managed by psychiatry.  She is no longer taking any antianxiety medications.  Iron deficiency noted on blood work with a significant drop in her iron levels.  She has been back on iron supplementations.  She has upcoming blood work with her PCP in 3 months to reevaluate her iron levels.       Dispo: Patient to return to clinic to see MD/APP in 6 months or sooner if needed to reevaluate symptoms.  Signed, Brighid Koch, NP

## 2022-11-04 NOTE — Patient Instructions (Signed)
Medication Instructions:  Your physician recommends that you continue on your current medications as directed. Please refer to the Current Medication list given to you today.  *If you need a refill on your cardiac medications before your next appointment, please call your pharmacy*  Lab Work: -None ordered  Testing/Procedures: -None ordered  Follow-Up: At [facility], you and your health needs are our priority.  As part of our continuing mission to provide you with exceptional heart care, we have created designated Provider Care Teams.  These Care Teams include your primary Cardiologist (physician) and Advanced Practice Providers (APPs -  Physician Assistants and Nurse Practitioners) who all work together to provide you with the care you need, when you need it.  Your next appointment:   6 month(s)  Provider:   Charlsie Quest, NP    Other Instructions -None

## 2022-11-05 LAB — HEP, RPR, HIV PANEL
HIV Screen 4th Generation wRfx: NONREACTIVE
Hepatitis B Surface Ag: NEGATIVE
RPR Ser Ql: NONREACTIVE

## 2022-11-06 NOTE — Progress Notes (Signed)
Obstetrics & Gynecology Office Visit   Chief Complaint: No chief complaint on file.   History of Present Illness: Cynthia Mcintosh presents for some STI screening. She shares that she routinely presents for STI sccreening, although she is not sexually active. She self describes as bisexual, and is not in a relationship at this time. She is a [employer] employee, and works with [employer]  (urine, cervical and vaginal swabs. For this reason she likes to have more frequent screening.  In addition, she has noticed a hard small bump just outside her right labia majora. She says that this comes and goes. She would like it evaluated. She shaves her entire mons and perineal area.  She has been treating this "cyst like ' lump with hot soaks.   Review of Systems:  Review of Systems  Constitutional: Negative.   HENT: Negative.    Eyes: Negative.   Respiratory: Negative.    Cardiovascular: Negative.   Gastrointestinal: Negative.   Musculoskeletal: Negative.   Skin:        Small .5cm hard mass/cyst on her right labial area  Neurological: Negative.   Endo/Heme/Allergies: Negative.   Psychiatric/Behavioral: Negative.       Past Medical History:  Past Medical History:  Diagnosis Date   Anxiety    Asthma 05/06/2019   COVID    Major depressive disorder, single episode, moderate (HCC) 10/24/2022    Past Surgical History:  Past Surgical History:  Procedure Laterality Date   TONSILECTOMY/ADENOIDECTOMY WITH MYRINGOTOMY      Gynecologic History: Patient's last menstrual period was 09/18/2022.  Obstetric History: G0P0000  Family History:  Family History  Problem Relation Age of Onset   Hypertension Mother    Hypercholesterolemia Mother    Hypertension Father    Thyroid disease Father    COPD Maternal Grandmother    Diabetes Maternal Grandfather    Hypertension Maternal Grandfather    Stroke Maternal Grandfather     Social History:  Social History   Socioeconomic History   Marital  status: Single    Spouse name: Not on file   Number of children: Not on file   Years of education: Not on file   Highest education level: Not on file  Occupational History   Not on file  Tobacco Use   Smoking status: Never   Smokeless tobacco: Never  Vaping Use   Vaping status: Never Used  Substance and Sexual Activity   Alcohol use: Not Currently   Drug use: Not Currently   Sexual activity: Yes    Birth control/protection: Pill  Other Topics Concern   Not on file  Social History Narrative   Are you right handed or left handed? Left   Are you currently employed ?    What is your current occupation? [employer]   Do you live at home alone?yes   Who lives with you?    What type of home do you live in: 1 story or 2 story? two    Caffeine none    Social Determinants of Health   Financial Resource Strain: Low Risk  (07/10/2021)   Received from [facility], [facility]   Overall Financial Resource Strain (CARDIA)    Difficulty of Paying Living Expenses: Not hard at all  Food Insecurity: No Food Insecurity (07/10/2021)   Received from [facility], [facility]   Hunger Vital Sign    Worried About Running Out of Food in the Last Year: Never true  Ran Out of Food in the Last Year: Never true  Transportation Needs: No Transportation Needs (07/10/2021)   Received from [facility], [facility]   [facility] - Transportation    Lack of Transportation (Medical): No    Lack of Transportation (Non-Medical): No  Physical Activity: Sufficiently Active (07/10/2021)   Received from [facility], [facility]   Exercise Vital Sign    Days of Exercise per Week: 5 days    Minutes of Exercise per Session: 30 min  Stress: Stress Concern Present (07/10/2021)   Received from [facility], [facility] of Occupational Health - Occupational Stress Questionnaire    Feeling of Stress : Rather much  Social Connections: Moderately Isolated (07/10/2021)    Received from [facility], [facility]   Social Connection and Isolation Panel [NHANES]    Frequency of Communication with Friends and Family: More than three times a week    Frequency of Social Gatherings with Friends and Family: More than three times a week    Attends Religious Services: 1 to 4 times per year    Active Member of [employer] or Organizations: No    Attends Banker Meetings: Never    Marital Status: Never married  Intimate Partner Violence: Not At Risk (07/10/2021)   Received from [facility], [facility]   Humiliation, Afraid, Rape, and Kick questionnaire    Fear of Current or Ex-Partner: No    Emotionally Abused: No    Physically Abused: No    Sexually Abused: No    Allergies:  No Known Allergies  Medications: Prior to Admission medications   Medication Sig Start Date End Date Taking? Authorizing Provider  albuterol (VENTOLIN HFA) 108 (90 Base) MCG/ACT inhaler Inhale 2 puffs into the lungs every 6 (six) hours as needed for wheezing or shortness of breath. 05/03/20   Delton Prairie, MD  ferrous sulfate 325 (65 FE) MG tablet Take 25 mg by mouth daily. 09/24/22   [provider]  Multiple Vitamin (MULTI-VITAMIN) tablet Take 1 tablet by mouth daily.    [provider]  Probiotic Product (PROBIOTIC DAILY PO) Take 1 capsule by mouth daily.    [provider]  VITAMIN D PO Take 1 capsule by mouth daily.    [provider]    Physical Exam Vitals:  Vitals:   11/04/22 1458  BP: 123/77  Pulse: 91   Patient's last menstrual period was 09/18/2022.  Physical Exam Constitutional:      Appearance: Normal appearance. She is normal weight.  Cardiovascular:     Rate and Rhythm: Normal rate and regular rhythm.  Pulmonary:     Effort: Pulmonary effort is normal.     Breath sounds: Normal breath sounds.  Abdominal:     General: Abdomen is flat.     Palpations: Abdomen is soft.  Genitourinary:    Rectum: Normal.      Comments: Shaves entire area.. Just outside her right labia is a small, hard,boil or cyst. Likely an ingrown hair or small boil. There is a small central porelike opening.  No vaginal discharge. Musculoskeletal:     Cervical back: Normal range of motion and neck supple.  Skin:    General: Skin is warm and dry.  Neurological:     General: No focal deficit present.     Mental Status: She is alert and oriented to person, place, and time.  Psychiatric:  Mood and Affect: Mood normal.        Behavior: Behavior normal.      Assessment: 37 y.o. G0P0000 fro STI screen Small labial boil or cyst.  Plan: Problem List Items Addressed This Visit   None Visit Diagnoses     Vaginal cyst    -  Primary   Relevant Orders   NuSwab Vaginitis Plus (VG+)   HEP, RPR, HIV Panel (Completed)   Screening examination for STD (sexually transmitted disease)       Relevant Orders   NuSwab Vaginitis Plus (VG+)   HEP, RPR, HIV Panel (Completed)     Aptima swab retrieved Blood drawn for HIV, RPR, Hep G.   I offered to open the boil with a syringe, but she prefers to soak this area and apply heat to see if it will resolve on its own.She iwll RTC if it does not resolve.  She will receive her lab results via MyChart.  Mirna Mires, CNM  11/06/2022 11:48 PM

## 2022-11-07 LAB — NUSWAB VAGINITIS PLUS (VG+)
Candida albicans, NAA: NEGATIVE
Candida glabrata, NAA: NEGATIVE
Chlamydia trachomatis, NAA: NEGATIVE
Neisseria gonorrhoeae, NAA: NEGATIVE
Trich vag by NAA: NEGATIVE

## 2022-11-09 IMAGING — CR DG CHEST 2V
2 series · 2 of 2 positions shown · non-contrast
Comparison: 08/31/2019

CLINICAL DATA: Shortness of breath and chest pressure.

EXAM:
CHEST - 2 VIEW

[chest pa]
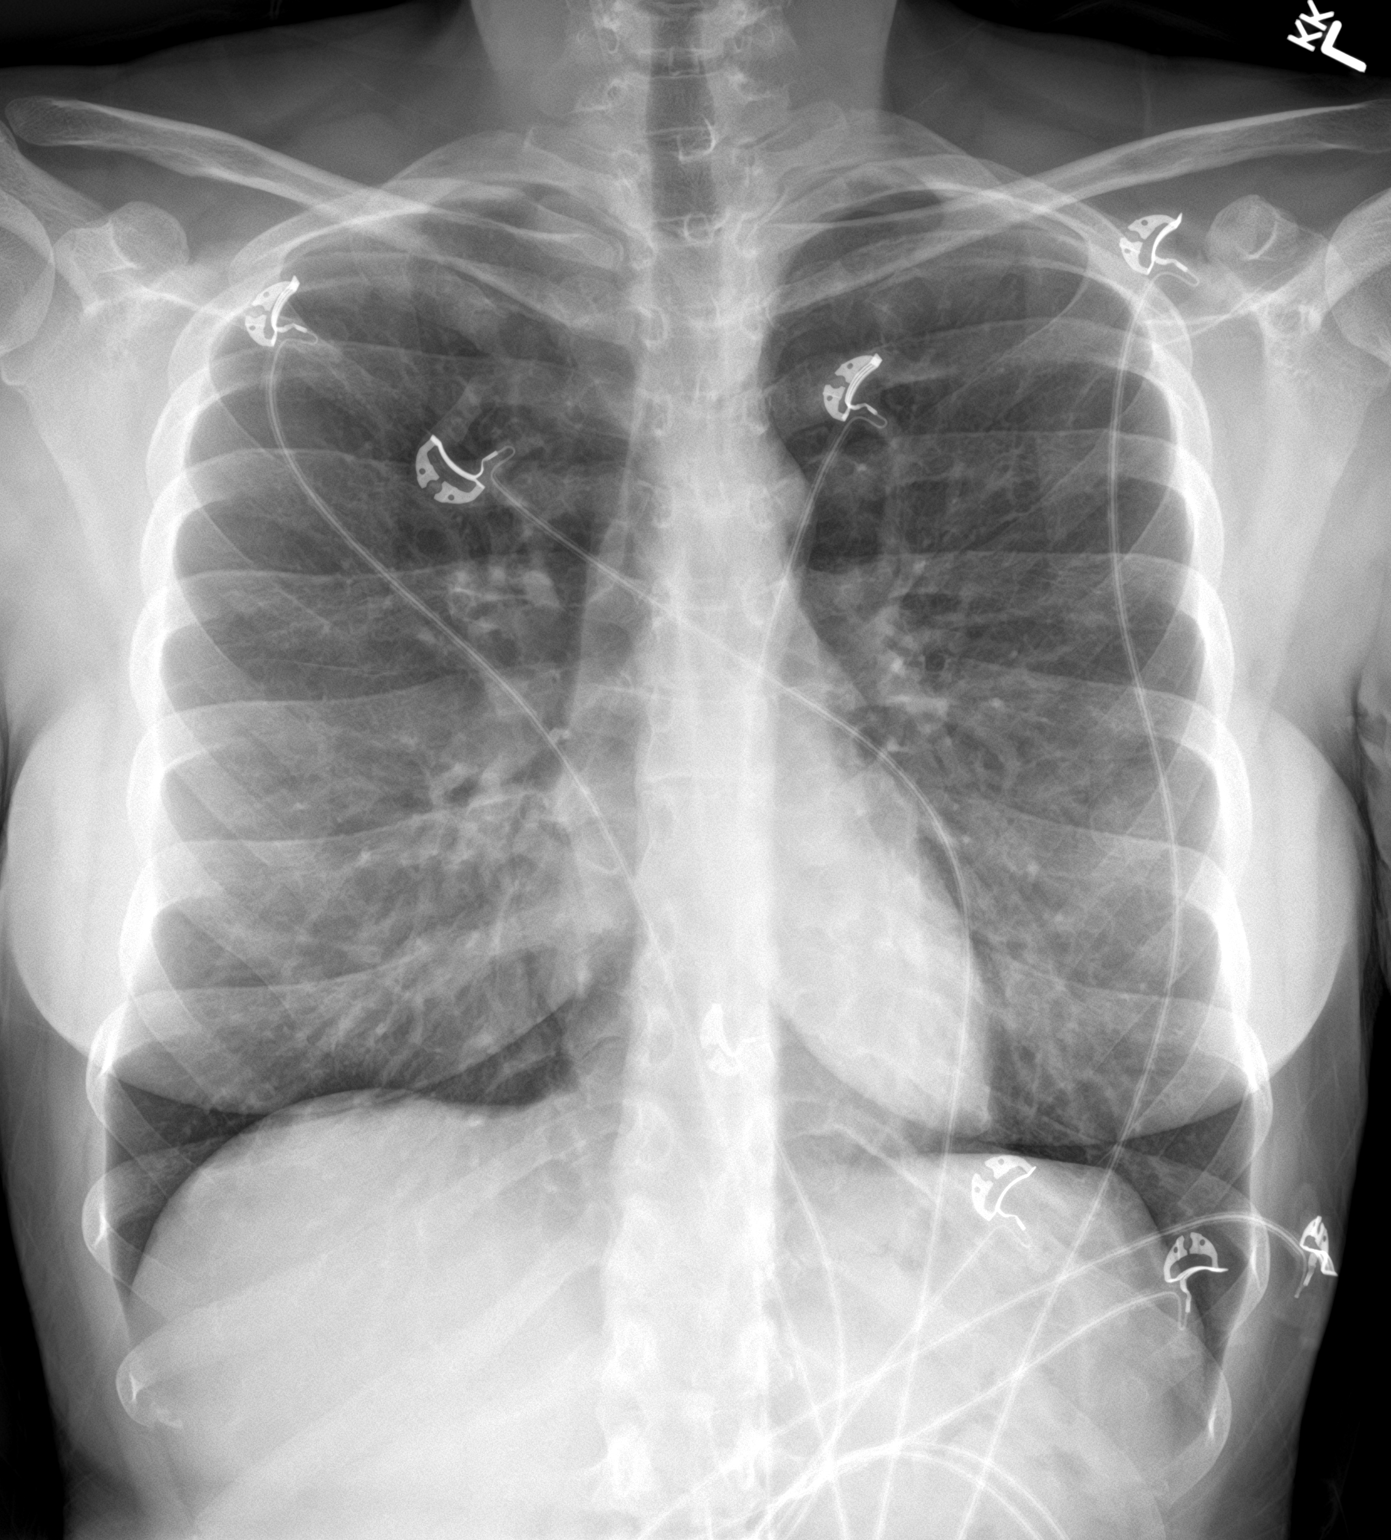

[chest lat]
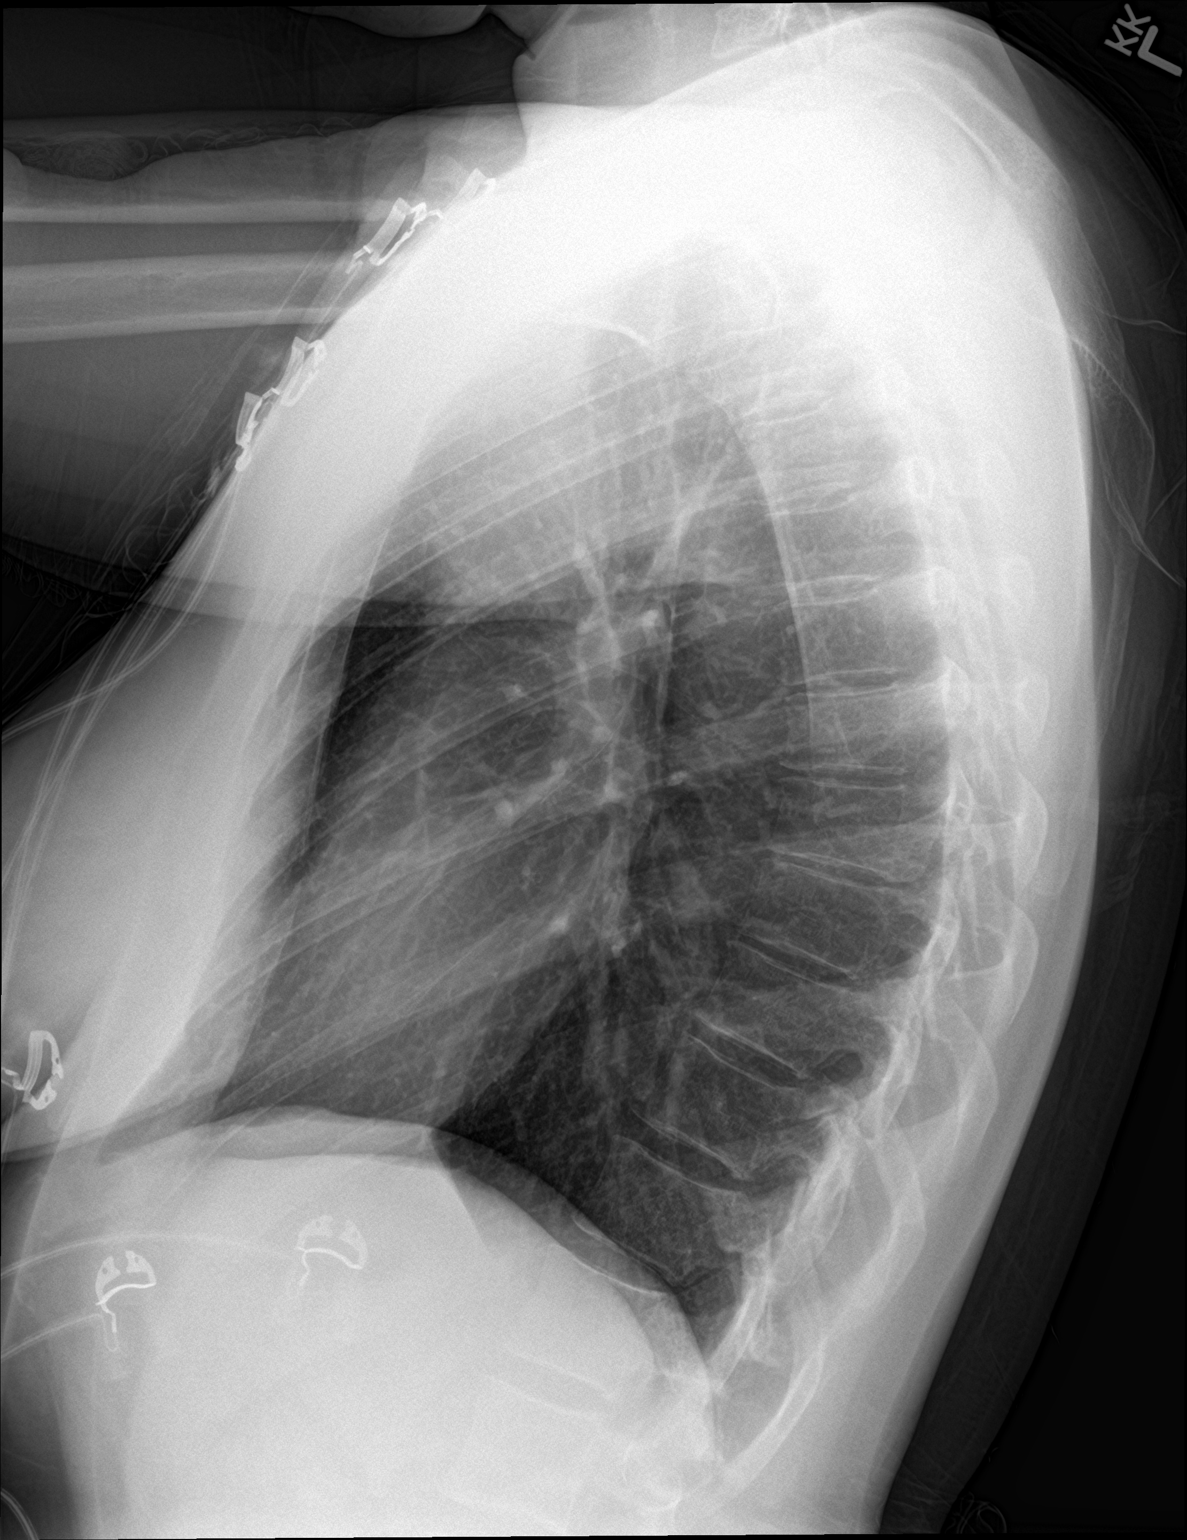

[2 of 2 positions shown; findings below may reference images not displayed]

FINDINGS: The lungs are clear without focal pneumonia, edema, pneumothorax or
pleural effusion. The cardiopericardial silhouette is within normal
limits for size. The visualized bony structures of the thorax show
no acute abnormality. Telemetry leads overlie the chest.
IMPRESSION: No active cardiopulmonary disease.

## 2022-12-08 ENCOUNTER — Ambulatory Visit: Payer: Managed Care, Other (non HMO) | Admitting: Obstetrics and Gynecology

## 2023-03-15 NOTE — Progress Notes (Deleted)
 GYNECOLOGY ANNUAL PHYSICAL EXAM PROGRESS NOTE  Subjective:    Cynthia Mcintosh is a 38 y.o. G0P0000 female who presents for an annual exam.  The patient {is/is not/has never been:13135} sexually active. The patient participates in regular exercise: {yes/no/not asked:9010}. Has the patient ever been transfused or tattooed?: {yes/no/not asked:9010}. The patient reports that there {is/is not:9024} domestic violence in her life.   The patient has the following complaints today:   Menstrual History: Menarche age: 50 No LMP recorded. (Menstrual status: Oral contraceptives).     Gynecologic History:  Contraception: oral progesterone-only contraceptive History of STI's: Yes Last Pap: 10/29/2019. Results were: normal.  Denies h/o abnormal pap smears.    OB History  Gravida Para Term Preterm AB Living  0 0 0 0 0 0  SAB IAB Ectopic Multiple Live Births  0 0 0 0 0    Past Medical History:  Diagnosis Date   Anxiety    Asthma 05/06/2019   COVID    Major depressive disorder, single episode, moderate (HCC) 10/24/2022    Past Surgical History:  Procedure Laterality Date   TONSILECTOMY/ADENOIDECTOMY WITH MYRINGOTOMY      Family History  Problem Relation Age of Onset   Hypertension Mother    Hypercholesterolemia Mother    Hypertension Father    Thyroid disease Father    COPD Maternal Grandmother    Diabetes Maternal Grandfather    Hypertension Maternal Grandfather    Stroke Maternal Grandfather     Social History   Socioeconomic History   Marital status: Single    Spouse name: Not on file   Number of children: Not on file   Years of education: Not on file   Highest education level: Not on file  Occupational History   Not on file  Tobacco Use   Smoking status: Never   Smokeless tobacco: Never  Vaping Use   Vaping status: Never Used  Substance and Sexual Activity   Alcohol use: Not Currently   Drug use: Not Currently   Sexual activity: Yes    Birth  control/protection: Pill  Other Topics Concern   Not on file  Social History Narrative   Are you right handed or left handed? Left   Are you currently employed ?    What is your current occupation? [employer]   Do you live at home alone?yes   Who lives with you?    What type of home do you live in: 1 story or 2 story? two    Caffeine none    Social Drivers of Corporate investment banker Strain: Low Risk  (07/10/2021)   Received from [facility], [facility]   Overall Financial Resource Strain (CARDIA)    Difficulty of Paying Living Expenses: Not hard at all  Food Insecurity: No Food Insecurity (07/10/2021)   Received from [facility], [facility]   Hunger Vital Sign    Worried About Running Out of Food in the Last Year: Never true    Ran Out of Food in the Last Year: Never true  Transportation Needs: No Transportation Needs (07/10/2021)   Received from [facility], [facility]   [facility] - Transportation    Lack of Transportation (Medical): No    Lack of Transportation (Non-Medical): No  Physical Activity: Sufficiently Active (07/10/2021)   Received from [facility], [facility]   Exercise Vital Sign    Days of Exercise per Week: 5 days  Minutes of Exercise per Session: 30 min  Stress: Stress Concern Present (07/10/2021)   Received from [facility], [facility]   [facility] of Occupational Health - Occupational Stress Questionnaire    Feeling of Stress : Rather much  Social Connections: Moderately Isolated (07/10/2021)   Received from [facility], [facility]   Social Connection and Isolation Panel [NHANES]    Frequency of Communication with Friends and Family: More than three times a week    Frequency of Social Gatherings with Friends and Family: More than three times a week    Attends Religious Services: 1 to 4 times per year    Active Member of [employer] or Organizations: No    Attends Banker Meetings: Never     Marital Status: Never married  Intimate Partner Violence: Not At Risk (07/10/2021)   Received from [facility], [facility]   Humiliation, Afraid, Rape, and Kick questionnaire    Fear of Current or Ex-Partner: No    Emotionally Abused: No    Physically Abused: No    Sexually Abused: No    Current Outpatient Medications on File Prior to Visit  Medication Sig Dispense Refill   albuterol (VENTOLIN HFA) 108 (90 Base) MCG/ACT inhaler Inhale 2 puffs into the lungs every 6 (six) hours as needed for wheezing or shortness of breath. 8 g 2   ferrous sulfate 325 (65 FE) MG tablet Take 25 mg by mouth daily.     Multiple Vitamin (MULTI-VITAMIN) tablet Take 1 tablet by mouth daily.     Probiotic Product (PROBIOTIC DAILY PO) Take 1 capsule by mouth daily.     VITAMIN D PO Take 1 capsule by mouth daily.     No current facility-administered medications on file prior to visit.    No Known Allergies   Review of Systems Constitutional: negative for chills, fatigue, fevers and sweats Eyes: negative for irritation, redness and visual disturbance Ears, nose, mouth, throat, and face: negative for hearing loss, nasal congestion, snoring and tinnitus Respiratory: negative for asthma, cough, sputum Cardiovascular: negative for chest pain, dyspnea, exertional chest pressure/discomfort, irregular heart beat, palpitations and syncope Gastrointestinal: negative for abdominal pain, change in bowel habits, nausea and vomiting Genitourinary: negative for abnormal menstrual periods, genital lesions, sexual problems and vaginal discharge, dysuria and urinary incontinence Integument/breast: negative for breast lump, breast tenderness and nipple discharge Hematologic/lymphatic: negative for bleeding and easy bruising Musculoskeletal:negative for back pain and muscle weakness Neurological: negative for dizziness, headaches, vertigo and weakness Endocrine: negative for diabetic symptoms including polydipsia,  polyuria and skin dryness Allergic/Immunologic: negative for hay fever and urticaria      Objective:  There were no vitals taken for this visit. There is no height or weight on file to calculate BMI.    General Appearance:    Alert, cooperative, no distress, appears stated age  Head:    Normocephalic, without obvious abnormality, atraumatic  Eyes:    PERRL, conjunctiva/corneas clear, EOM's intact, both eyes  Ears:    Normal external ear canals, both ears  Nose:   Nares normal, septum midline, mucosa normal, no drainage or sinus tenderness  Throat:   Lips, mucosa, and tongue normal; teeth and gums normal  Neck:   Supple, symmetrical, trachea midline, no adenopathy; thyroid: no enlargement/tenderness/nodules; no carotid bruit or JVD  Back:     Symmetric, no curvature, ROM normal, no CVA tenderness  Lungs:     Clear to auscultation bilaterally, respirations unlabored  Chest  Wall:    No tenderness or deformity   Heart:    Regular rate and rhythm, S1 and S2 normal, no murmur, rub or gallop  Breast Exam:    No tenderness, masses, or nipple abnormality  Abdomen:     Soft, non-tender, bowel sounds active all four quadrants, no masses, no organomegaly.    Genitalia:    Pelvic:external genitalia normal, vagina without lesions, discharge, or tenderness, rectovaginal septum  normal. Cervix normal in appearance, no cervical motion tenderness, no adnexal masses or tenderness.  Uterus normal size, shape, mobile, regular contours, nontender.  Rectal:    Normal external sphincter.  No hemorrhoids appreciated. Internal exam not done.   Extremities:   Extremities normal, atraumatic, no cyanosis or edema  Pulses:   2+ and symmetric all extremities  Skin:   Skin color, texture, turgor normal, no rashes or lesions  Lymph nodes:   Cervical, supraclavicular, and axillary nodes normal  Neurologic:   CNII-XII intact, normal strength, sensation and reflexes throughout   .  Labs:  Lab Results  Component Value  Date   WBC 8.8 10/05/2022   HGB 13.2 10/05/2022   HCT 41.4 10/05/2022   MCV 97 10/05/2022   PLT 212 10/05/2022    Lab Results  Component Value Date   CREATININE 0.80 10/05/2022   BUN 14 10/05/2022   NA 139 10/05/2022   K 4.0 10/05/2022   CL 104 10/05/2022   CO2 22 10/05/2022    Lab Results  Component Value Date   ALT 14 10/05/2022   AST 20 10/05/2022   ALKPHOS 56 10/05/2022   BILITOT <0.2 10/05/2022    Lab Results  Component Value Date   TSH 1.850 07/14/2022     Assessment:   1. Screening examination for STD (sexually transmitted disease)   2. Prediabetes   3. Screening for lipid disorders   4. Encounter for well woman exam with routine gynecological exam      Plan:  Blood tests: Pending. Breast self exam technique reviewed and patient encouraged to perform self-exam monthly. Contraception: oral progesterone-only contraceptive. Discussed healthy lifestyle modifications. Mammogram  : Not age appropriate Pap smear ordered. Flu vaccine: Follow up in 1 year for annual exam   Hildred Laser, MD [facility]

## 2023-03-15 NOTE — Patient Instructions (Incomplete)
 Preventive Care 38-38 Years Old, Female Preventive care refers to lifestyle choices and visits with your health care provider that can promote health and wellness. Preventive care visits are also called wellness exams. What can I expect for my preventive care visit? Counseling During your preventive care visit, your health care provider may ask about your: Medical history, including: Past medical problems. Family medical history. Pregnancy history. Current health, including: Menstrual cycle. Method of birth control. Emotional well-being. Home life and relationship well-being. Sexual activity and sexual health. Lifestyle, including: Alcohol, nicotine or tobacco, and drug use. Access to firearms. Diet, exercise, and sleep habits. Work and work Astronomer. Sunscreen use. Safety issues such as seatbelt and bike helmet use. Physical exam Your health care provider may check your: Height and weight. These may be used to calculate your BMI (body mass index). BMI is a measurement that tells if you are at a healthy weight. Waist circumference. This measures the distance around your waistline. This measurement also tells if you are at a healthy weight and may help predict your risk of certain diseases, such as type 2 diabetes and high blood pressure. Heart rate and blood pressure. Body temperature. Skin for abnormal spots. What immunizations do I need?  Vaccines are usually given at various ages, according to a schedule. Your health care provider will recommend vaccines for you based on your age, medical history, and lifestyle or other factors, such as travel or where you work. What tests do I need? Screening Your health care provider may recommend screening tests for certain conditions. This may include: Pelvic exam and Pap test. Lipid and cholesterol levels. Diabetes screening. This is done by checking your blood sugar (glucose) after you have not eaten for a while (fasting). Hepatitis  B test. Hepatitis C test. HIV (human immunodeficiency virus) test. STI (sexually transmitted infection) testing, if you are at risk. BRCA-related cancer screening. This may be done if you have a family history of breast, ovarian, tubal, or peritoneal cancers. Talk with your health care provider about your test results, treatment options, and if necessary, the need for more tests. Follow these instructions at home: Eating and drinking  Eat a healthy diet that includes fresh fruits and vegetables, whole grains, lean protein, and low-fat dairy products. Take vitamin and mineral supplements as recommended by your health care provider. Do not drink alcohol if: Your health care provider tells you not to drink. You are pregnant, may be pregnant, or are planning to become pregnant. If you drink alcohol: Limit how much you have to 0-1 drink a day. Know how much alcohol is in your drink. In the [place] one drink equals one 12 oz bottle of beer (355 mL), one 5 oz glass of wine (148 mL), or one 1 oz glass of hard liquor (44 mL). Lifestyle Brush your teeth every morning and night with fluoride toothpaste. Floss one time each day. Exercise for at least 30 minutes 5 or more days each week. Do not use any products that contain nicotine or tobacco. These products include cigarettes, chewing tobacco, and vaping devices, such as e-cigarettes. If you need help quitting, ask your health care provider. Do not use drugs. If you are sexually active, practice safe sex. Use a condom or other form of protection to prevent STIs. If you do not wish to become pregnant, use a form of birth control. If you plan to become pregnant, see your health care provider for a prepregnancy visit. Find healthy ways to manage stress, such as: Meditation,  yoga, or listening to music. Journaling. Talking to a trusted person. Spending time with friends and family. Minimize exposure to UV radiation to reduce your risk of skin  cancer. Safety Always wear your seat belt while driving or riding in a vehicle. Do not drive: If you have been drinking alcohol. Do not ride with someone who has been drinking. If you have been using any mind-altering substances or drugs. While texting. When you are tired or distracted. Wear a helmet and other protective equipment during sports activities. If you have firearms in your house, make sure you follow all gun safety procedures. Seek help if you have been physically or sexually abused. What's next? Go to your health care provider once a year for an annual wellness visit. Ask your health care provider how often you should have your eyes and teeth checked. Stay up to date on all vaccines. This information is not intended to replace advice given to you by your health care provider. Make sure you discuss any questions you have with your health care provider. Document Revised: 07/09/2020 Document Reviewed: 07/09/2020 Elsevier Patient Education  2024 Elsevier Inc. Breast Self-Awareness Breast self-awareness is knowing how your breasts look and feel. You need to: Check your breasts on a regular basis. Tell your doctor about any changes. Become familiar with the look and feel of your breasts. This can help you catch a breast problem while it is still small and can be treated. You should do breast self-exams even if you have breast implants. What you need: A mirror. A well-lit room. A pillow or other soft object. How to do a breast self-exam Follow these steps to do a breast self-exam: Look for changes  Take off all the clothes above your waist. Stand in front of a mirror in a room with good lighting. Put your hands down at your sides. Compare your breasts in the mirror. Look for any difference between them, such as: A difference in shape. A difference in size. Wrinkles, dips, and bumps in one breast and not the other. Look at each breast for changes in the skin, such  as: Redness. Scaly areas. Skin that has gotten thicker. Dimpling. Open sores (ulcers). Look for changes in your nipples, such as: Fluid coming out of a nipple. Fluid around a nipple. Bleeding. Dimpling. Redness. A nipple that looks pushed in (retracted), or that has changed position. Feel for changes Lie on your back. Feel each breast. To do this: Pick a breast to feel. Place a pillow under the shoulder closest to that breast. Put the arm closest to that breast behind your head. Feel the nipple area of that breast using the hand of your other arm. Feel the area with the pads of your three middle fingers by making small circles with your fingers. Use light, medium, and firm pressure. Continue the overlapping circles, moving downward over the breast. Keep making circles with your fingers. Stop when you feel your ribs. Start making circles with your fingers again, this time going upward until you reach your collarbone. Then, make circles outward across your breast and into your armpit area. Squeeze your nipple. Check for discharge and lumps. Repeat these steps to check your other breast. Sit or stand in the tub or shower. With soapy water on your skin, feel each breast the same way you did when you were lying down. Write down what you find Writing down what you find can help you remember what to tell your doctor. Write down: What is  normal for each breast. Any changes you find in each breast. These include: The kind of changes you find. A tender or painful breast. Any lump you find. Write down its size and where it is. When you last had your monthly period (menstrual cycle). General tips If you are breastfeeding, the best time to check your breasts is after you feed your baby or after you use a breast pump. If you get monthly bleeding, the best time to check your breasts is 5-7 days after your monthly cycle ends. With time, you will become comfortable with the self-exam. You will  also start to know if there are changes in your breasts. Contact a doctor if: You see a change in the shape or size of your breasts or nipples. You see a change in the skin of your breast or nipples, such as red or scaly skin. You have fluid coming from your nipples that is not normal. You find a new lump or thick area. You have breast pain. You have any concerns about your breast health. Summary Breast self-awareness includes looking for changes in your breasts and feeling for changes within your breasts. You should do breast self-awareness in front of a mirror in a well-lit room. If you get monthly periods (menstrual cycles), the best time to check your breasts is 5-7 days after your period ends. Tell your doctor about any changes you see in your breasts. Changes include changes in size, changes on the skin, painful or tender breasts, or fluid from your nipples that is not normal. This information is not intended to replace advice given to you by your health care provider. Make sure you discuss any questions you have with your health care provider. Document Revised: 06/18/2021 Document Reviewed: 11/13/2020 Elsevier Patient Education  2024 [employer].

## 2023-03-16 ENCOUNTER — Ambulatory Visit: Payer: Managed Care, Other (non HMO) | Admitting: Obstetrics and Gynecology

## 2023-04-20 NOTE — Progress Notes (Deleted)
 GYNECOLOGY ANNUAL PHYSICAL EXAM PROGRESS NOTE  Subjective:    Cynthia Mcintosh is a 38 y.o. G0P0000 female who presents for an annual exam.  The patient {is/is not/has never been:13135} sexually active. The patient participates in regular exercise: {yes/no/not asked:9010}. Has the patient ever been transfused or tattooed?: {yes/no/not asked:9010}. The patient reports that there {is/is not:9024} domestic violence in her life.   The patient has the following complaints today:   Menstrual History: Menarche age: *** No LMP recorded. (Menstrual status: Oral contraceptives).     Gynecologic History:  Contraception: {method:5051} History of STI's:  Last Pap: 10/29/19. Results were: normal.  Denies h/o abnormal pap smears. Last mammogram: N/A. Results were: N/A       OB History  Gravida Para Term Preterm AB Living  0 0 0 0 0 0  SAB IAB Ectopic Multiple Live Births  0 0 0 0 0    Past Medical History:  Diagnosis Date   Anxiety    Asthma 05/06/2019   COVID    Major depressive disorder, single episode, moderate (HCC) 10/24/2022    Past Surgical History:  Procedure Laterality Date   TONSILECTOMY/ADENOIDECTOMY WITH MYRINGOTOMY      Family History  Problem Relation Age of Onset   Hypertension Mother    Hypercholesterolemia Mother    Hypertension Father    Thyroid disease Father    COPD Maternal Grandmother    Diabetes Maternal Grandfather    Hypertension Maternal Grandfather    Stroke Maternal Grandfather     Social History   Socioeconomic History   Marital status: Single    Spouse name: Not on file   Number of children: Not on file   Years of education: Not on file   Highest education level: Not on file  Occupational History   Not on file  Tobacco Use   Smoking status: Never   Smokeless tobacco: Never  Vaping Use   Vaping status: Never Used  Substance and Sexual Activity   Alcohol use: Not Currently   Drug use: Not Currently   Sexual activity: Yes     Birth control/protection: Pill  Other Topics Concern   Not on file  Social History Narrative   Are you right handed or left handed? Left   Are you currently employed ?    What is your current occupation? [employer]   Do you live at home alone?yes   Who lives with you?    What type of home do you live in: 1 story or 2 story? two    Caffeine none    Social Drivers of Corporate investment banker Strain: Low Risk  (07/10/2021)   Received from [facility], [facility]   Overall Financial Resource Strain (CARDIA)    Difficulty of Paying Living Expenses: Not hard at all  Food Insecurity: No Food Insecurity (07/10/2021)   [facility], [facility]   Hunger Vital Sign    Worried About Running Out of Food in the Last Year: Never true    Ran Out of Food in the Last Year: Never true  Transportation Needs: No Transportation Needs (07/10/2021)   Received from [facility]   [facility] - Transportation    Lack of Transportation (Medical): No    Lack of Transportation (Non-Medical): No  Physical Activity: Sufficiently Active (07/10/2021)   Received from [facility], [facility]   Exercise Vital Sign    Days of Exercise  per Week: 5 days    Minutes of Exercise per Session: 30 min  Stress: Stress Concern Present (07/10/2021)   Received from [facility], [facility]   [facility] - Occupational Stress Questionnaire    Feeling of Stress : Rather much  Social Connections: Moderately Isolated (07/10/2021)   Received from [facility], [facility]   Social Connection and Isolation Panel [NHANES]    Frequency of Communication with Friends and Family: More than three times a week    Frequency of Social Gatherings with Friends and Family: More than three times a week    Attends Religious Services: 1 to 4 times per year    Active Member of [employer] or Organizations: No    Attends Banker Meetings:  Never    Marital Status: Never married  Intimate Partner Violence: Not At Risk (07/10/2021)   Received from [facility], [facility]   Humiliation, Afraid, Rape, and Kick questionnaire    Fear of Current or Ex-Partner: No    Emotionally Abused: No    Physically Abused: No    Sexually Abused: No    Current Outpatient Medications on File Prior to Visit  Medication Sig Dispense Refill   albuterol (VENTOLIN HFA) 108 (90 Base) MCG/ACT inhaler Inhale 2 puffs into the lungs every 6 (six) hours as needed for wheezing or shortness of breath. 8 g 2   ferrous sulfate 325 (65 FE) MG tablet Take 25 mg by mouth daily.     Multiple Vitamin (MULTI-VITAMIN) tablet Take 1 tablet by mouth daily.     Probiotic Product (PROBIOTIC DAILY PO) Take 1 capsule by mouth daily.     VITAMIN D PO Take 1 capsule by mouth daily.     No current facility-administered medications on file prior to visit.    No Known Allergies   Review of Systems Constitutional: negative for chills, fatigue, fevers and sweats Eyes: negative for irritation, redness and visual disturbance Ears, nose, mouth, throat, and face: negative for hearing loss, nasal congestion, snoring and tinnitus Respiratory: negative for asthma, cough, sputum Cardiovascular: negative for chest pain, dyspnea, exertional chest pressure/discomfort, irregular heart beat, palpitations and syncope Gastrointestinal: negative for abdominal pain, change in bowel habits, nausea and vomiting Genitourinary: negative for abnormal menstrual periods, genital lesions, sexual problems and vaginal discharge, dysuria and urinary incontinence Integument/breast: negative for breast lump, breast tenderness and nipple discharge Hematologic/lymphatic: negative for bleeding and easy bruising Musculoskeletal:negative for back pain and muscle weakness Neurological: negative for dizziness, headaches, vertigo and weakness Endocrine: negative for diabetic symptoms including  polydipsia, polyuria and skin dryness Allergic/Immunologic: negative for hay fever and urticaria      Objective:  There were no vitals taken for this visit. There is no height or weight on file to calculate BMI.    General Appearance:    Alert, cooperative, no distress, appears stated age  Head:    Normocephalic, without obvious abnormality, atraumatic  Eyes:    PERRL, conjunctiva/corneas clear, EOM's intact, both eyes  Ears:    Normal external ear canals, both ears  Nose:   Nares normal, septum midline, mucosa normal, no drainage or sinus tenderness  Throat:   Lips, mucosa, and tongue normal; teeth and gums normal  Neck:   Supple, symmetrical, trachea midline, no adenopathy; thyroid: no enlargement/tenderness/nodules; no carotid bruit or JVD  Back:     Symmetric, no curvature, ROM normal, no CVA tenderness  Lungs:     Clear  to auscultation bilaterally, respirations unlabored  Chest Wall:    No tenderness or deformity   Heart:    Regular rate and rhythm, S1 and S2 normal, no murmur, rub or gallop  Breast Exam:    No tenderness, masses, or nipple abnormality  Abdomen:     Soft, non-tender, bowel sounds active all four quadrants, no masses, no organomegaly.    Genitalia:    Pelvic:external genitalia normal, vagina without lesions, discharge, or tenderness, rectovaginal septum  normal. Cervix normal in appearance, no cervical motion tenderness, no adnexal masses or tenderness.  Uterus normal size, shape, mobile, regular contours, nontender.  Rectal:    Normal external sphincter.  No hemorrhoids appreciated. Internal exam not done.   Extremities:   Extremities normal, atraumatic, no cyanosis or edema  Pulses:   2+ and symmetric all extremities  Skin:   Skin color, texture, turgor normal, no rashes or lesions  Lymph nodes:   Cervical, supraclavicular, and axillary nodes normal  Neurologic:   CNII-XII intact, normal strength, sensation and reflexes throughout   .  Labs:  Lab Results   Component Value Date   WBC 8.8 10/05/2022   HGB 13.2 10/05/2022   HCT 41.4 10/05/2022   MCV 97 10/05/2022   PLT 212 10/05/2022    Lab Results  Component Value Date   CREATININE 0.80 10/05/2022   BUN 14 10/05/2022   NA 139 10/05/2022   K 4.0 10/05/2022   CL 104 10/05/2022   CO2 22 10/05/2022    Lab Results  Component Value Date   ALT 14 10/05/2022   AST 20 10/05/2022   ALKPHOS 56 10/05/2022   BILITOT <0.2 10/05/2022    Lab Results  Component Value Date   TSH 1.850 07/14/2022     Assessment:   1. Screening for cervical cancer   2. Encounter for well woman exam with routine gynecological exam   3. Screening for STD (sexually transmitted disease)      Plan:  Blood tests: {blood tests:13147}. Breast self exam technique reviewed and patient encouraged to perform self-exam monthly. Contraception: {contraceptive methods:5051}. Discussed healthy lifestyle modifications. Mammogram {discussed/ordered:14545} Pap smear {discussed/ordered:14545}. Flu vaccine: Follow up in 1 year for annual exam   Rocco Serene, LPN [facility]

## 2023-04-21 ENCOUNTER — Ambulatory Visit: Payer: Managed Care, Other (non HMO) | Admitting: Obstetrics and Gynecology

## 2023-04-21 DIAGNOSIS — Z124 Encounter for screening for malignant neoplasm of cervix: Secondary | ICD-10-CM

## 2023-04-21 DIAGNOSIS — Z01419 Encounter for gynecological examination (general) (routine) without abnormal findings: Secondary | ICD-10-CM

## 2023-04-21 DIAGNOSIS — Z113 Encounter for screening for infections with a predominantly sexual mode of transmission: Secondary | ICD-10-CM

## 2023-04-29 ENCOUNTER — Encounter: Payer: Self-pay | Admitting: Family

## 2023-04-29 ENCOUNTER — Ambulatory Visit: Admitting: Family

## 2023-04-29 VITALS — BP 110/64 | HR 76 | Ht 67.0 in | Wt 156.2 lb

## 2023-04-29 DIAGNOSIS — E538 Deficiency of other specified B group vitamins: Secondary | ICD-10-CM

## 2023-04-29 DIAGNOSIS — I10 Essential (primary) hypertension: Secondary | ICD-10-CM | POA: Diagnosis not present

## 2023-04-29 DIAGNOSIS — R5383 Other fatigue: Secondary | ICD-10-CM

## 2023-04-29 DIAGNOSIS — E559 Vitamin D deficiency, unspecified: Secondary | ICD-10-CM | POA: Diagnosis not present

## 2023-04-29 DIAGNOSIS — R300 Dysuria: Secondary | ICD-10-CM

## 2023-04-29 DIAGNOSIS — E611 Iron deficiency: Secondary | ICD-10-CM

## 2023-04-29 DIAGNOSIS — J4550 Severe persistent asthma, uncomplicated: Secondary | ICD-10-CM

## 2023-04-29 DIAGNOSIS — E782 Mixed hyperlipidemia: Secondary | ICD-10-CM

## 2023-04-29 DIAGNOSIS — N946 Dysmenorrhea, unspecified: Secondary | ICD-10-CM

## 2023-04-29 DIAGNOSIS — R7303 Prediabetes: Secondary | ICD-10-CM

## 2023-04-30 LAB — MICROSCOPIC EXAMINATION
Bacteria, UA: NONE SEEN
Casts: NONE SEEN /LPF
Epithelial Cells (non renal): NONE SEEN /HPF (ref 0–10)
RBC, Urine: NONE SEEN /HPF (ref 0–2)
WBC, UA: NONE SEEN /HPF (ref 0–5)

## 2023-04-30 LAB — URINALYSIS, COMPLETE
Bilirubin, UA: NEGATIVE
Glucose, UA: NEGATIVE
Ketones, UA: NEGATIVE
Leukocytes,UA: NEGATIVE
Nitrite, UA: NEGATIVE
Protein,UA: NEGATIVE
RBC, UA: NEGATIVE
Specific Gravity, UA: 1.019 (ref 1.005–1.030)
Urobilinogen, Ur: 0.2 mg/dL (ref 0.2–1.0)
pH, UA: 6 (ref 5.0–7.5)

## 2023-05-01 NOTE — Progress Notes (Signed)
 Established Patient Office Visit  Subjective:  Patient ID: Cynthia Mcintosh, female    DOB: 10-28-1985  Age: 38 y.o. MRN: [id number]  Chief Complaint  Patient presents with   Follow-up    Wants labs checked    Patient is here today for follow up.  She wants to have her labs rechecked, says that she has been continuing to have issues with GI symptoms that we have discussed before, as well as systemic symptoms that she believes are coming from things she is eating.     No other concerns at this time.   Past Medical History:  Diagnosis Date   Anxiety    Asthma 05/06/2019   COVID    Major depressive disorder, single episode, moderate (HCC) 10/24/2022    Past Surgical History:  Procedure Laterality Date   TONSILECTOMY/ADENOIDECTOMY WITH MYRINGOTOMY      Social History   Socioeconomic History   Marital status: Single    Spouse name: Not on file   Number of children: Not on file   Years of education: Not on file   Highest education level: Not on file  Occupational History   Not on file  Tobacco Use   Smoking status: Never   Smokeless tobacco: Never  Vaping Use   Vaping status: Never Used  Substance and Sexual Activity   Alcohol use: Not Currently   Drug use: Not Currently   Sexual activity: Yes    Birth control/protection: Pill  Other Topics Concern   Not on file  Social History Narrative   Are you right handed or left handed? Left   Are you currently employed ?    What is your current occupation? [employer]   Do you live at home alone?yes   Who lives with you?    What type of home do you live in: 1 story or 2 story? two    Caffeine none    Social Drivers of Corporate investment banker Strain: Low Risk  (07/10/2021)   Received from [facility], [facility]   Overall Financial Resource Strain (CARDIA)    Difficulty of Paying Living Expenses: Not hard at all  Food Insecurity: No Food Insecurity (07/10/2021)   Received from [facility], [facility]    Hunger Vital Sign    Worried About Running Out of Food in the Last Year: Never true    Ran Out of Food in the Last Year: Never true  Transportation Needs: No Transportation Needs (07/10/2021)   Received from [facility], [facility]   [facility] - Transportation    Lack of Transportation (Medical): No    Lack of Transportation (Non-Medical): No  Physical Activity: Sufficiently Active (07/10/2021)   Received from [facility], [facility]   Exercise Vital Sign    Days of Exercise per Week: 5 days    Minutes of Exercise per Session: 30 min  Stress: Stress Concern Present (07/10/2021)   Received from [facility], [facility] - Occupational Stress Questionnaire    Feeling of Stress : Rather much  Social Connections: Moderately Isolated (07/10/2021)   Received from [facility], [facility]   Social Connection and Isolation Panel [NHANES]    Frequency of Communication with Friends and Family: More than three times a week    Frequency of Social Gatherings with Friends and Family: More than three times a week    Attends Religious  Services: 1 to 4 times per year    Active Member of Clubs or Organizations: No    Attends Banker Meetings: Never    Marital Status: Never married  Intimate Partner Violence: Not At Risk (07/10/2021)   Received from [facility], [facility]   Humiliation, Afraid, Rape, and Kick questionnaire    Fear of Current or Ex-Partner: No    Emotionally Abused: No    Physically Abused: No    Sexually Abused: No    Family History  Problem Relation Age of Onset   Hypertension Mother    Hypercholesterolemia Mother    Hypertension Father    Thyroid  disease Father    COPD Maternal Grandmother    Diabetes Maternal Grandfather    Hypertension Maternal Grandfather    Stroke Maternal Grandfather     No Known Allergies  Review of Systems  Constitutional:  Positive for  malaise/fatigue.  Gastrointestinal:  Positive for abdominal pain and heartburn.  Skin:  Positive for itching and rash.  All other systems reviewed and are negative.      Objective:   BP 110/64   Pulse 76   Ht 5' 7 (1.702 m)   Wt 156 lb 3.2 oz (70.9 kg)   SpO2 99%   BMI 24.46 kg/m   Vitals:   04/29/23 1523  BP: 110/64  Pulse: 76  Height: 5' 7 (1.702 m)  Weight: 156 lb 3.2 oz (70.9 kg)  SpO2: 99%  BMI (Calculated): 24.46    Physical Exam Vitals and nursing note reviewed.  Constitutional:      Appearance: Normal appearance. She is normal weight.  HENT:     Head: Normocephalic.  Eyes:     Extraocular Movements: Extraocular movements intact.     Conjunctiva/sclera: Conjunctivae normal.     Pupils: Pupils are equal, round, and reactive to light.  Cardiovascular:     Rate and Rhythm: Normal rate.  Pulmonary:     Effort: Pulmonary effort is normal.  Neurological:     General: No focal deficit present.     Mental Status: She is alert and oriented to person, place, and time. Mental status is at baseline.  Psychiatric:        Mood and Affect: Mood normal.        Behavior: Behavior normal.        Thought Content: Thought content normal.      Results for orders placed or performed in visit on 04/29/23  Microscopic Examination  Result Value Ref Range   WBC, UA None seen 0 - 5 /hpf   RBC, Urine None seen 0 - 2 /hpf   Epithelial Cells (non renal) None seen 0 - 10 /hpf   Casts None seen None seen /lpf   Bacteria, UA None seen None seen/Few  Urinalysis, Complete (81001)  Result Value Ref Range   Specific Gravity, UA 1.019 1.005 - 1.030   pH, UA 6.0 5.0 - 7.5   Color, UA Yellow Yellow   Appearance Ur Clear Clear   Leukocytes,UA Negative Negative   Protein,UA Negative Negative/Trace   Glucose, UA Negative Negative   Ketones, UA Negative Negative   RBC, UA Negative Negative   Bilirubin, UA Negative Negative   Urobilinogen, Ur 0.2 0.2 - 1.0 mg/dL   Nitrite, UA  Negative Negative   Microscopic Examination Comment    Microscopic Examination See below:     Recent Results (from the past 2160 hours)  Urinalysis, Complete (81001)  Status: None   Collection Time: 04/29/23  4:32 PM  Result Value Ref Range   Specific Gravity, UA 1.019 1.005 - 1.030   pH, UA 6.0 5.0 - 7.5   Color, UA Yellow Yellow   Appearance Ur Clear Clear   Leukocytes,UA Negative Negative   Protein,UA Negative Negative/Trace   Glucose, UA Negative Negative   Ketones, UA Negative Negative   RBC, UA Negative Negative   Bilirubin, UA Negative Negative   Urobilinogen, Ur 0.2 0.2 - 1.0 mg/dL   Nitrite, UA Negative Negative   Microscopic Examination Comment     Comment: Microscopic follows if indicated.   Microscopic Examination See below:     Comment: Microscopic was indicated and was performed.  Microscopic Examination     Status: None   Collection Time: 04/29/23  4:32 PM  Result Value Ref Range   WBC, UA None seen 0 - 5 /hpf   RBC, Urine None seen 0 - 2 /hpf   Epithelial Cells (non renal) None seen 0 - 10 /hpf   Casts None seen None seen /lpf   Bacteria, UA None seen None seen/Few       Assessment & Plan:   Problem List Items Addressed This Visit       Respiratory   Severe persistent asthma without complication   Relevant Orders   CMP14+EGFR   CBC with Differential/Platelet   Magnesium     Other   Prediabetes   Relevant Orders   CMP14+EGFR   Hemoglobin A1c   CBC with Differential/Platelet   Magnesium   Other Visit Diagnoses       Vitamin D  deficiency, unspecified    -  Primary   Relevant Orders   VITAMIN D  25 Hydroxy (Vit-D Deficiency, Fractures)   CMP14+EGFR   CBC with Differential/Platelet   Magnesium     B12 deficiency due to diet       Relevant Orders   CMP14+EGFR   Vitamin B12   CBC with Differential/Platelet   Magnesium     Other fatigue       Relevant Orders   CMP14+EGFR   TSH   CBC with Differential/Platelet   TSH+T4F+T3Free    Anti-TPO Ab (RDL)   FSH+Prog+E2+SHBG   Magnesium     Essential hypertension, benign       Relevant Orders   CMP14+EGFR   CBC with Differential/Platelet   Magnesium     Mixed hyperlipidemia       Relevant Orders   Lipid panel   CMP14+EGFR   CBC with Differential/Platelet   Magnesium     Iron deficiency       Relevant Orders   CMP14+EGFR   CBC with Differential/Platelet   Iron, TIBC and Ferritin Panel   Magnesium     Dysuria       Relevant Orders   Urinalysis, Complete (81001) (Completed)     Dysmenorrhea       Relevant Orders   Anti mullerian hormone       Return in about 3 months (around 07/29/2023) for F/U.   Total time spent: 20 minutes  ALAN CHRISTELLA ARRANT, FNP  04/29/2023   This document may have been prepared by [facility] Voice Recognition software and as such may include unintentional dictation errors.

## 2023-05-05 LAB — LIPID PANEL
Chol/HDL Ratio: 2.2 ratio (ref 0.0–4.4)
Cholesterol, Total: 175 mg/dL (ref 100–199)
HDL: 78 mg/dL (ref >39)
LDL Chol Calc (NIH): 77 mg/dL (ref 0–99)
Triglycerides: 115 mg/dL (ref 0–149)
VLDL Cholesterol Cal: 20 mg/dL (ref 5–40)

## 2023-05-05 LAB — HEMOGLOBIN A1C
Est. average glucose Bld gHb Est-mCnc: 114 mg/dL
Hgb A1c MFr Bld: 5.6 % (ref 4.8–5.6)

## 2023-05-05 LAB — CMP14+EGFR
ALT: 15 IU/L (ref 0–32)
AST: 25 IU/L (ref 0–40)
Albumin: 4.3 g/dL (ref 3.9–4.9)
Alkaline Phosphatase: 48 IU/L (ref 44–121)
BUN/Creatinine Ratio: 30 — ABNORMAL HIGH (ref 9–23)
BUN: 25 mg/dL — ABNORMAL HIGH (ref 6–20)
Bilirubin Total: <0.2 mg/dL (ref 0.0–1.2)
CO2: 21 mmol/L (ref 20–29)
Calcium: 9.3 mg/dL (ref 8.7–10.2)
Chloride: 101 mmol/L (ref 96–106)
Creatinine, Ser: 0.82 mg/dL (ref 0.57–1.00)
Globulin, Total: 3 g/dL (ref 1.5–4.5)
Glucose: 84 mg/dL (ref 70–99)
Potassium: 4.2 mmol/L (ref 3.5–5.2)
Sodium: 137 mmol/L (ref 134–144)
Total Protein: 7.3 g/dL (ref 6.0–8.5)
eGFR: 94 mL/min/{1.73_m2} (ref >59)

## 2023-05-05 LAB — FSH+PROG+E2+SHBG
Estradiol: 176 pg/mL
FSH: 3.9 m[IU]/mL
Progesterone: 0.3 ng/mL
Sex Hormone Binding: 79 nmol/L (ref 24.6–122.0)

## 2023-05-05 LAB — IRON,TIBC AND FERRITIN PANEL
Ferritin: 86 ng/mL (ref 15–150)
Iron Saturation: 23 % (ref 15–55)
Iron: 76 ug/dL (ref 27–159)
Total Iron Binding Capacity: 336 ug/dL (ref 250–450)
UIBC: 260 ug/dL (ref 131–425)

## 2023-05-05 LAB — ANTI-TPO AB (RDL): Anti-TPO Ab (RDL): 673.7 [IU]/mL — ABNORMAL HIGH (ref <9.0)

## 2023-05-05 LAB — TSH+T4F+T3FREE
Free T4: 1.14 ng/dL (ref 0.82–1.77)
T3, Free: 2.4 pg/mL (ref 2.0–4.4)
TSH: 2.39 u[IU]/mL (ref 0.450–4.500)

## 2023-05-05 LAB — VITAMIN B12: Vitamin B-12: 1376 pg/mL — ABNORMAL HIGH (ref 232–1245)

## 2023-05-05 LAB — MAGNESIUM: Magnesium: 2 mg/dL (ref 1.6–2.3)

## 2023-05-05 LAB — VITAMIN D 25 HYDROXY (VIT D DEFICIENCY, FRACTURES): Vit D, 25-Hydroxy: 49.4 ng/mL (ref 30.0–100.0)

## 2023-05-06 LAB — ANTI MULLERIAN HORMONE: ANTI-MULLERIAN HORMONE (AMH): 2.79 ng/mL

## 2023-05-10 ENCOUNTER — Ambulatory Visit: Admitting: Obstetrics and Gynecology

## 2023-05-10 ENCOUNTER — Encounter: Payer: Self-pay | Admitting: Obstetrics and Gynecology

## 2023-05-10 VITALS — BP 108/79 | HR 96 | Ht 67.0 in | Wt 159.8 lb

## 2023-05-10 DIAGNOSIS — Z124 Encounter for screening for malignant neoplasm of cervix: Secondary | ICD-10-CM

## 2023-05-10 DIAGNOSIS — Z01419 Encounter for gynecological examination (general) (routine) without abnormal findings: Secondary | ICD-10-CM | POA: Diagnosis not present

## 2023-05-10 DIAGNOSIS — Z113 Encounter for screening for infections with a predominantly sexual mode of transmission: Secondary | ICD-10-CM

## 2023-05-10 DIAGNOSIS — R635 Abnormal weight gain: Secondary | ICD-10-CM

## 2023-05-10 NOTE — Progress Notes (Signed)
 GYNECOLOGY ANNUAL PHYSICAL EXAM PROGRESS NOTE  Subjective:    Cynthia Mcintosh is a 38 y.o. G0P0000 female who presents for an annual exam.  The patient is not sexually active. The patient participates in regular exercise: yes. Has the patient ever been transfused or tattooed?: yes. The patient reports that there is not domestic violence in her life.   The patient has the following complaints today: Is concerned about her weight.  Notes that she is feeling her metabolism is slowing down. Also noting more food sensitivities, especially with regards to gluten. Has modified her diet.  Had recent labs performed with PCP, but desires routine STD screening. Currently abstinent.    Menstrual History: Menarche age: 40 Patient's last menstrual period was 04/20/2023 (exact date). Period Cycle (Days): 28 Period Duration (Days): 6 Period Pattern: Regular Menstrual Flow: Light, Moderate Menstrual Control: Thin pad, Maxi pad, Tampon Menstrual Control Change Freq (Hours): 3-4 Dysmenorrhea: (!) Mild Dysmenorrhea Symptoms: Cramping   Gynecologic History:  Contraception: none History of STI's:  Last Pap: 10/29/19. Results were: normal.  Denies h/o abnormal pap smears. Last mammogram: N/A. Results were: N/A   Upstream - 05/10/23 0809       Contraception Wrap Up   End Method Abstinence    Contraception Counseling Provided No    How was the end contraceptive method provided? N/A            The pregnancy intention screening data noted above was reviewed. Potential methods of contraception were discussed. The patient elected to proceed with Abstinence.   OB History  Gravida Para Term Preterm AB Living  0 0 0 0 0 0  SAB IAB Ectopic Multiple Live Births  0 0 0 0 0    Past Medical History:  Diagnosis Date   Anxiety    Asthma 05/06/2019   COVID    Major depressive disorder, single episode, moderate (HCC) 10/24/2022    Past Surgical History:  Procedure Laterality Date    TONSILECTOMY/ADENOIDECTOMY WITH MYRINGOTOMY      Family History  Problem Relation Age of Onset   Hypertension Mother    Hypercholesterolemia Mother    Hypertension Father    Thyroid disease Father    COPD Maternal Grandmother    Diabetes Maternal Grandfather    Hypertension Maternal Grandfather    Stroke Maternal Grandfather     Social History   Socioeconomic History   Marital status: Single    Spouse name: Not on file   Number of children: Not on file   Years of education: Not on file   Highest education level: Not on file  Occupational History   Not on file  Tobacco Use   Smoking status: Never   Smokeless tobacco: Never  Vaping Use   Vaping status: Never Used  Substance and Sexual Activity   Alcohol use: Not Currently   Drug use: Not Currently   Sexual activity: Yes    Birth control/protection: Pill  Other Topics Concern   Not on file  Social History Narrative   Are you right handed or left handed? Left   Are you currently employed ?    What is your current occupation? [employer]   Do you live at home alone?yes   Who lives with you?    What type of home do you live in: 1 story or 2 story? two    Caffeine none    Social Drivers of Corporate investment banker Strain: Low Risk  (07/10/2021)   Received  from [facility], [facility]   Overall Financial Resource Strain (CARDIA)    Difficulty of Paying Living Expenses: Not hard at all  Food Insecurity: No Food Insecurity (07/10/2021)   [facility], [facility]   Hunger Vital Sign    Worried About Running Out of Food in the Last Year: Never true    Ran Out of Food in the Last Year: Never true  Transportation Needs: No Transportation Needs (07/10/2021)   Received from [facility], [facility]   [facility] - Transportation    Lack of Transportation (Medical): No    Lack of Transportation (Non-Medical): No  Physical Activity: Sufficiently Active (07/10/2021)   Received from [facility], [facility]   Exercise Vital Sign    Days of Exercise per Week: 5 days    Minutes of Exercise per Session: 30 min  Stress: Stress Concern Present (07/10/2021)   Received from [facility], [facility] - Occupational Stress Questionnaire    Feeling of Stress : Rather much  Social Connections: Moderately Isolated (07/10/2021)   Received from [facility], [facility]   Social Connection and Isolation Panel [NHANES]    Frequency of Communication with Friends and Family: More than three times a week    Frequency of Social Gatherings with Friends and Family: More than three times a week    Attends Religious Services: 1 to 4 times per year    Active Member of [employer] or Organizations: No    Attends Banker Meetings: Never    Marital Status: Never married  Intimate Partner Violence: Not At Risk (07/10/2021)   Received from [facility], [facility]   Humiliation, Afraid, Rape, and Kick questionnaire    Fear of Current or Ex-Partner: No    Emotionally Abused: No    Physically Abused: No    Sexually Abused: No    Current Outpatient Medications on File Prior to Visit  Medication Sig Dispense Refill   albuterol (VENTOLIN HFA) 108 (90 Base) MCG/ACT inhaler Inhale 2 puffs into the lungs every 6 (six) hours as needed for wheezing or shortness of breath. 8 g 2   ferrous sulfate 325 (65 FE) MG tablet Take 25 mg by mouth daily.     Multiple Vitamin (MULTI-VITAMIN) tablet Take 1 tablet by mouth daily.     Probiotic Product (PROBIOTIC DAILY PO) Take 1 capsule by mouth daily.     RYALTRIS 161-09 MCG/ACT SUSP SMARTSIG:1-2 Spray(s) Both Nares Daily     VITAMIN D PO Take 1 capsule by mouth daily.     No current facility-administered medications on file prior to visit.    No Known Allergies   Review of Systems Constitutional: negative for chills, fatigue, fevers and sweats. Positive for weight gain.  Eyes: negative  for irritation, redness and visual disturbance Ears, nose, mouth, throat, and face: negative for hearing loss, nasal congestion, snoring and tinnitus Respiratory: negative for asthma, cough, sputum Cardiovascular: negative for chest pain, dyspnea, exertional chest pressure/discomfort, irregular heart beat, palpitations and syncope Gastrointestinal: negative for abdominal pain, change in bowel habits, nausea and vomiting Genitourinary: negative for abnormal menstrual periods, genital lesions, sexual problems and vaginal discharge, dysuria and urinary incontinence Integument/breast: negative for breast lump, breast tenderness and nipple discharge Hematologic/lymphatic: negative for bleeding and easy bruising Musculoskeletal:negative for back pain and muscle weakness Neurological: negative for dizziness, headaches, vertigo and weakness  Endocrine: negative for diabetic symptoms including polydipsia, polyuria and skin dryness Allergic/Immunologic: negative for hay fever and urticaria      Objective:  Blood pressure 108/79, pulse 96, height 5\' 7"  (1.702 m), weight 159 lb 12.8 oz (72.5 kg), last menstrual period 04/20/2023.  Body mass index is 25.03 kg/m.    General Appearance:    Alert, cooperative, no distress, appears stated age  Head:    Normocephalic, without obvious abnormality, atraumatic  Eyes:    PERRL, conjunctiva/corneas clear, EOM's intact, both eyes  Ears:    Normal external ear canals, both ears  Nose:   Nares normal, septum midline, mucosa normal, no drainage or sinus tenderness  Throat:   Lips, mucosa, and tongue normal; teeth and gums normal  Neck:   Supple, symmetrical, trachea midline, no adenopathy; thyroid: no enlargement/tenderness/nodules; no carotid bruit or JVD  Back:     Symmetric, no curvature, ROM normal, no CVA tenderness  Lungs:     Clear to auscultation bilaterally, respirations unlabored  Chest Wall:    No tenderness or deformity   Heart:    Regular rate and  rhythm, S1 and S2 normal, no murmur, rub or gallop  Breast Exam:    No tenderness, masses, or nipple abnormality  Abdomen:     Soft, non-tender, bowel sounds active all four quadrants, no masses, no organomegaly.    Genitalia:    Pelvic:external genitalia normal, vagina without lesions, discharge, or tenderness, rectovaginal septum  normal. Cervix normal in appearance, no cervical motion tenderness, no adnexal masses or tenderness.  Uterus normal size, shape, mobile, regular contours, nontender.  Rectal:    Normal external sphincter.  No hemorrhoids appreciated. Internal exam not done.   Extremities:   Extremities normal, atraumatic, no cyanosis or edema  Pulses:   2+ and symmetric all extremities  Skin:   Skin color, texture, turgor normal, no rashes or lesions  Lymph nodes:   Cervical, supraclavicular, and axillary nodes normal  Neurologic:   CNII-XII intact, normal strength, sensation and reflexes throughout   .  Labs:  Lab Results  Component Value Date   WBC 8.8 10/05/2022   HGB 13.2 10/05/2022   HCT 41.4 10/05/2022   MCV 97 10/05/2022   PLT 212 10/05/2022    Lab Results  Component Value Date   CREATININE 0.82 04/29/2023   BUN 25 (H) 04/29/2023   NA 137 04/29/2023   K 4.2 04/29/2023   CL 101 04/29/2023   CO2 21 04/29/2023    Lab Results  Component Value Date   ALT 15 04/29/2023   AST 25 04/29/2023   ALKPHOS 48 04/29/2023   BILITOT <0.2 04/29/2023    Lab Results  Component Value Date   TSH 2.390 04/29/2023   Lab Results  Component Value Date   HGBA1C 5.6 04/29/2023    Assessment:   1. Encounter for well woman exam with routine gynecological exam   2. Screen for STD (sexually transmitted disease)   3. Cervical cancer screening   4. Weight gain      Plan:  - Blood tests: up to date.  STD screening ordered per patient request.  - Breast self exam technique reviewed and patient encouraged to perform self-exam monthly. - Contraception: abstinence. -  Discussed healthy lifestyle modifications. Given handout on natural remedies/foods to boost metabolism. Review of chart notes similar weight around this time last year, but several visits late last year noting weight ~ 7-8 lbs of weight loss.  - Mammogram  Not age appropriate.  To begin screens at age 65.  - Pap smear ordered. - Flu vaccine: up to date.  - Follow up in 1 year for annual exam   Teresa Fender, MD [facility]

## 2023-05-11 LAB — HEPATITIS B SURFACE ANTIGEN: Hepatitis B Surface Ag: NEGATIVE

## 2023-05-11 LAB — HSV 1 AND 2 AB, IGG
HSV 1 Glycoprotein G Ab, IgG: NONREACTIVE
HSV 2 IgG, Type Spec: NONREACTIVE

## 2023-05-11 LAB — RPR: RPR Ser Ql: NONREACTIVE

## 2023-05-11 LAB — HIV ANTIBODY (ROUTINE TESTING W REFLEX): HIV Screen 4th Generation wRfx: NONREACTIVE

## 2023-05-11 LAB — HEPATITIS C ANTIBODY: Hep C Virus Ab: NONREACTIVE

## 2023-05-12 ENCOUNTER — Encounter: Payer: Self-pay | Admitting: Obstetrics and Gynecology

## 2023-05-12 LAB — HPV, COBAS HIGH-RISK/16/18
HPV 16: NEGATIVE
HPV 18: NEGATIVE
HPV other hr types: POSITIVE — AB

## 2023-07-01 ENCOUNTER — Encounter: Payer: Self-pay | Admitting: Family

## 2023-07-06 ENCOUNTER — Ambulatory Visit: Admitting: Family

## 2023-07-14 ENCOUNTER — Encounter: Payer: Self-pay | Admitting: Family

## 2023-07-14 ENCOUNTER — Ambulatory Visit: Admitting: Family

## 2023-07-14 VITALS — BP 110/78 | HR 95 | Ht 67.0 in | Wt 157.2 lb

## 2023-07-14 DIAGNOSIS — K22710 Barrett's esophagus with low grade dysplasia: Secondary | ICD-10-CM

## 2023-07-14 DIAGNOSIS — K2100 Gastro-esophageal reflux disease with esophagitis, without bleeding: Secondary | ICD-10-CM | POA: Diagnosis not present

## 2023-07-14 DIAGNOSIS — R7303 Prediabetes: Secondary | ICD-10-CM

## 2023-07-14 DIAGNOSIS — R202 Paresthesia of skin: Secondary | ICD-10-CM

## 2023-07-14 DIAGNOSIS — R42 Dizziness and giddiness: Secondary | ICD-10-CM

## 2023-07-14 DIAGNOSIS — Z0130 Encounter for examination of blood pressure without abnormal findings: Secondary | ICD-10-CM

## 2023-07-14 DIAGNOSIS — E559 Vitamin D deficiency, unspecified: Secondary | ICD-10-CM

## 2023-07-14 DIAGNOSIS — E611 Iron deficiency: Secondary | ICD-10-CM

## 2023-07-14 DIAGNOSIS — K9049 Malabsorption due to intolerance, not elsewhere classified: Secondary | ICD-10-CM | POA: Diagnosis not present

## 2023-07-14 DIAGNOSIS — R768 Other specified abnormal immunological findings in serum: Secondary | ICD-10-CM

## 2023-07-14 DIAGNOSIS — R946 Abnormal results of thyroid function studies: Secondary | ICD-10-CM

## 2023-07-14 DIAGNOSIS — R55 Syncope and collapse: Secondary | ICD-10-CM

## 2023-07-14 DIAGNOSIS — E538 Deficiency of other specified B group vitamins: Secondary | ICD-10-CM

## 2023-07-14 NOTE — Progress Notes (Signed)
 Established Patient Office Visit  Subjective:  Patient ID: Cynthia Mcintosh, female    DOB: 02/18/1985  Age: 38 y.o. MRN: [id number]  Chief Complaint  Patient presents with   Follow-up    Discuss GI issues   Patient is here today to discuss her GI issues.  She has been having continued trouble with these and is asking for a referral to [facility].   Symptoms:  Acid reflux Bloating/gas Food sensitivities Diarrhea Nausea Brain fog after eating Rash/skin irritation Energy drainage while in a flareup Eating safe foods 1 day and then on another day the same foods cause inflammation. 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     No other concerns at this time.   Past Medical History:  Diagnosis Date   Anxiety    Asthma 05/06/2019   COVID    Major depressive disorder, single episode, moderate (HCC) 10/24/2022    Past Surgical History:  Procedure Laterality Date   TONSILECTOMY/ADENOIDECTOMY WITH MYRINGOTOMY      Social History   Socioeconomic History   Marital status: Single    Spouse name: Not on file   Number of children: Not on file   Years of education: Not on file   Highest education level: Not on file  Occupational History   Not on file  Tobacco Use   Smoking status: Never   Smokeless tobacco: Never  Vaping Use   Vaping status: Never Used  Substance and Sexual Activity   Alcohol use: Not Currently   Drug use: Not Currently   Sexual activity: Yes    Birth control/protection: Pill  Other Topics Concern   Not on file  Social History Narrative   Are you right handed or left handed? Left   Are you currently employed ?    What is your current occupation? [employer]   Do you live at home alone?yes   Who lives with you?    What type of home do you live in: 1 story or 2 story? two    Caffeine none    Social Drivers of Corporate investment banker  Strain: Low Risk  (07/10/2021)   Received from [facility]   Overall Financial Resource Strain (CARDIA)    Difficulty of Paying Living Expenses: Not hard at all  Food Insecurity: No Food Insecurity (07/10/2021)   Received from [facility]   Hunger Vital Sign    Within the past 12 months, you worried that your food would run out before you got the money to buy more.: Never true    Within the past 12 months, the food you bought just didn't last and you didn't have money to get more.: Never true  Transportation Needs: No Transportation Needs (07/10/2021)   Received from [facility]   PRAPARE - Transportation    Lack of Transportation (Medical): No    Lack of Transportation (Non-Medical): No  Physical Activity: Sufficiently Active (07/10/2021)   Received from [facility]   Exercise Vital Sign    On average, how many days per week do you engage in moderate to strenuous exercise (like a brisk walk)?: 5 days    On average, how many minutes do you engage in exercise at this level?: 30 min  Stress: Stress Concern Present (07/10/2021)   Received from [facility] - Occupational  Stress Questionnaire    Feeling of Stress : Rather much  Social Connections: Moderately Isolated (07/10/2021)   Received from [facility]   Social Connection and Isolation Panel    In a typical week, how many times do you talk on the phone with family, friends, or neighbors?: More than three times a week    How often do you get together with friends or relatives?: More than three times a week    How often do you attend church or religious services?: 1 to 4 times per year    Do you belong to any clubs or organizations such as church groups, unions, fraternal or athletic groups, or school groups?: No    How often do you attend meetings of the clubs or organizations you belong to?: Never    Are you married, widowed, divorced, separated, never married, or living with  a partner?: Never married  Intimate Partner Violence: Not At Risk (07/10/2021)   Received from [facility]   Humiliation, Afraid, Rape, and Kick questionnaire    Within the last year, have you been afraid of your partner or ex-partner?: No    Within the last year, have you been humiliated or emotionally abused in other ways by your partner or ex-partner?: No    Within the last year, have you been kicked, hit, slapped, or otherwise physically hurt by your partner or ex-partner?: No    Within the last year, have you been raped or forced to have any kind of sexual activity by your partner or ex-partner?: No    Family History  Problem Relation Age of Onset   Hypertension Mother    Hypercholesterolemia Mother    Hypertension Father    Thyroid  disease Father    COPD Maternal Grandmother    Diabetes Maternal Grandfather    Hypertension Maternal Grandfather    Stroke Maternal Grandfather     No Known Allergies  Review of Systems  Gastrointestinal:  Positive for abdominal pain, diarrhea, heartburn, nausea and vomiting.  All other systems reviewed and are negative.      Objective:   BP 110/78   Pulse 95   Ht 5' 7 (1.702 m)   Wt 157 lb 3.2 oz (71.3 kg)   SpO2 99%   BMI 24.62 kg/m   Vitals:   07/14/23 0959  BP: 110/78  Pulse: 95  Height: 5' 7 (1.702 m)  Weight: 157 lb 3.2 oz (71.3 kg)  SpO2: 99%  BMI (Calculated): 24.62    Physical Exam Vitals and nursing note reviewed.  Constitutional:      Appearance: Normal appearance. She is normal weight.  HENT:     Head: Normocephalic.   Eyes:     Extraocular Movements: Extraocular movements intact.     Conjunctiva/sclera: Conjunctivae normal.     Pupils: Pupils are equal, round, and reactive to light.    Cardiovascular:     Rate and Rhythm: Normal rate.  Pulmonary:     Effort: Pulmonary effort is normal.   Neurological:     General: No focal deficit present.     Mental Status: She is alert and oriented to person,  place, and time. Mental status is at baseline.   Psychiatric:        Mood and Affect: Mood normal.        Behavior: Behavior normal.        Thought Content: Thought content normal.      No results found for any visits on 07/14/23.  Recent Results (from the past 2160 hours)  Lipid panel     Status: None   Collection Time: 04/29/23  4:21 PM  Result Value Ref Range   Cholesterol, Total 175 100 - 199 mg/dL   Triglycerides 884 0 - 149 mg/dL   HDL 78 >60 mg/dL   VLDL Cholesterol Cal 20 5 - 40 mg/dL   LDL Chol Calc (NIH) 77 0 - 99 mg/dL   Chol/HDL Ratio 2.2 0.0 - 4.4 ratio    Comment:                                   T. Chol/HDL Ratio                                             Men  Women                               1/2 Avg.Risk  3.4    3.3                                   Avg.Risk  5.0    4.4                                2X Avg.Risk  9.6    7.1                                3X Avg.Risk 23.4   11.0   VITAMIN D  25 Hydroxy (Vit-D Deficiency, Fractures)     Status: None   Collection Time: 04/29/23  4:21 PM  Result Value Ref Range   Vit D, 25-Hydroxy 49.4 30.0 - 100.0 ng/mL    Comment: Vitamin D  deficiency has been defined by the Institute of Medicine and an Endocrine Society practice guideline as a level of serum 25-OH vitamin D  less than 20 ng/mL (1,2). The Endocrine Society went on to further define vitamin D  insufficiency as a level between 21 and 29 ng/mL (2). 1. IOM (Institute of Medicine). 2010. Dietary reference    intakes for calcium and D. [place]: The    [employer]. 2. Holick MF, Binkley [place], Bischoff-Ferrari HA, et al.    Evaluation, treatment, and prevention of vitamin D     deficiency: an Endocrine Society clinical practice    guideline. JCEM. 2011 Jul; 96(7):1911-30.   CMP14+EGFR     Status: Abnormal   Collection Time: 04/29/23  4:21 PM  Result Value Ref Range   Glucose 84 70 - 99 mg/dL   BUN 25 (H) 6 - 20 mg/dL   Creatinine, Ser 9.17  0.57 - 1.00 mg/dL   eGFR 94 >40 fO/fpw/8.26   BUN/Creatinine Ratio 30 (H) 9 - 23   Sodium 137 134 - 144 mmol/L   Potassium 4.2 3.5 - 5.2 mmol/L   Chloride 101 96 - 106 mmol/L   CO2 21 20 - 29 mmol/L   Calcium 9.3 8.7 - 10.2 mg/dL   Total Protein 7.3 6.0 - 8.5 g/dL   Albumin 4.3 3.9 - 4.9 g/dL  Globulin, Total 3.0 1.5 - 4.5 g/dL   Bilirubin Total <9.7 0.0 - 1.2 mg/dL   Alkaline Phosphatase 48 44 - 121 IU/L   AST 25 0 - 40 IU/L   ALT 15 0 - 32 IU/L  Hemoglobin A1c     Status: None   Collection Time: 04/29/23  4:21 PM  Result Value Ref Range   Hgb A1c MFr Bld 5.6 4.8 - 5.6 %    Comment:          Prediabetes: 5.7 - 6.4          Diabetes: >6.4          Glycemic control for adults with diabetes: <7.0    Est. average glucose Bld gHb Est-mCnc 114 mg/dL  Vitamin B12     Status: Abnormal   Collection Time: 04/29/23  4:21 PM  Result Value Ref Range   Vitamin B-12 1,376 (H) 232 - 1,245 pg/mL  Iron, TIBC and Ferritin Panel     Status: None   Collection Time: 04/29/23  4:21 PM  Result Value Ref Range   Total Iron Binding Capacity 336 250 - 450 ug/dL   UIBC 739 868 - 574 ug/dL   Iron 76 27 - 840 ug/dL   Iron Saturation 23 15 - 55 %   Ferritin 86 15 - 150 ng/mL  TSH+T4F+T3Free     Status: None   Collection Time: 04/29/23  4:21 PM  Result Value Ref Range   TSH 2.390 0.450 - 4.500 uIU/mL   T3, Free 2.4 2.0 - 4.4 pg/mL   Free T4 1.14 0.82 - 1.77 ng/dL  Anti-TPO Ab (RDL)     Status: Abnormal   Collection Time: 04/29/23  4:21 PM  Result Value Ref Range   Anti-TPO Ab (RDL) 673.7 (H) <9.0 IU/mL  FSH+Prog+E2+SHBG     Status: None   Collection Time: 04/29/23  4:21 PM  Result Value Ref Range   FSH 3.9 mIU/mL    Comment:                      Adult Female             Range                       Follicular phase      3.5 -  12.5                       Ovulation phase       4.7 -  21.5                       Luteal phase          1.7 -   7.7                       Postmenopausal       25.8 -  134.8    Progesterone 0.3 ng/mL    Comment:                      Follicular phase       0.1 -   0.9                      Luteal phase           1.8 -  23.9  Ovulation phase        0.1 -  12.0                      Pregnant                         First trimester    11.0 -  44.3                         Second trimester   25.4 -  83.3                         Third trimester    58.7 - 214.0                      Postmenopausal         0.0 -   0.1    Sex Hormone Binding 79.0 24.6 - 122.0 nmol/L   Estradiol  176.0 pg/mL    Comment:                      Adult Female             Range                       Follicular phase     12.5 - 166.0                       Ovulation phase      85.8 - 498.0                       Luteal phase         43.8 - 211.0                       Postmenopausal       <6.0 -  54.7                      Pregnancy                       1st trimester     215.0 - >4300.0 Roche ECLIA methodology   Magnesium     Status: None   Collection Time: 04/29/23  4:21 PM  Result Value Ref Range   Magnesium 2.0 1.6 - 2.3 mg/dL  Anti mullerian hormone     Status: None   Collection Time: 04/29/23  4:27 PM  Result Value Ref Range   ANTI-MULLERIAN HORMONE (AMH) 2.79 ng/mL    Comment: For assays employing antibodies, the possibility exists for interference by heterophile antibodies in the samples.1  1.Kricka L.  Interferences in Immunoassays - still a threat.  Clin. Chem. 2000; 46: 8962-8961.  This test was developed and its performance characteristics determined by [facility]. It has not been cleared or approved by the Food and Drug Administration. Reference Range: Females 36 - 40y: 0.42 - 8.34 Median  1.69 AMH concentrations of >= 1.06 ng/mL is correlated with a better response to ovarian stimulation, produced more retrievable oocytes and higher odds of live birth according to Gleicher et al.  Luanna and Sterility. 2010: 05:7175-7172.  The current AMH test  method correlates with the study method with a slope of 0.94. Females at risk of  ovarian hyperstimulation syndrome or polycystic ovarian syndrome (PCOS) may exhibit elevated serum AMH concentrations.   AMH levels from PCOS patients may be 2 to 5 fold higher than age-appropriate reference in terval values. Granulosa cell tumors of the ovary may secrete AMH along with other tumor markers.  Elevated AMH is not specific for malignancy, and the assay should not be used exclusively to diagnose or exclude an AMH-secreting ovarian tumor.   Urinalysis, Complete (81001)     Status: None   Collection Time: 04/29/23  4:32 PM  Result Value Ref Range   Specific Gravity, UA 1.019 1.005 - 1.030   pH, UA 6.0 5.0 - 7.5   Color, UA Yellow Yellow   Appearance Ur Clear Clear   Leukocytes,UA Negative Negative   Protein,UA Negative Negative/Trace   Glucose, UA Negative Negative   Ketones, UA Negative Negative   RBC, UA Negative Negative   Bilirubin, UA Negative Negative   Urobilinogen, Ur 0.2 0.2 - 1.0 mg/dL   Nitrite, UA Negative Negative   Microscopic Examination Comment     Comment: Microscopic follows if indicated.   Microscopic Examination See below:     Comment: Microscopic was indicated and was performed.  Microscopic Examination     Status: None   Collection Time: 04/29/23  4:32 PM  Result Value Ref Range   WBC, UA None seen 0 - 5 /hpf   RBC, Urine None seen 0 - 2 /hpf   Epithelial Cells (non renal) None seen 0 - 10 /hpf   Casts None seen None seen /lpf   Bacteria, UA None seen None seen/Few  HIV Antibody (routine testing w rflx)     Status: None   Collection Time: 05/10/23  2:57 PM  Result Value Ref Range   HIV Screen 4th Generation wRfx Non Reactive Non Reactive    Comment: HIV-1/HIV-2 antibodies and HIV-1 p24 antigen were NOT detected. There is no laboratory evidence of HIV infection. HIV Negative   RPR     Status: None   Collection Time: 05/10/23  2:57 PM  Result Value Ref Range    RPR Ser Ql Non Reactive Non Reactive  Hepatitis B surface antigen     Status: None   Collection Time: 05/10/23  2:57 PM  Result Value Ref Range   Hepatitis B Surface Ag Negative Negative  Hepatitis C antibody     Status: None   Collection Time: 05/10/23  2:57 PM  Result Value Ref Range   Hep C Virus Ab Non Reactive Non Reactive    Comment: HCV antibody alone does not differentiate between previously resolved infection and active infection. Equivocal and Reactive HCV antibody results should be followed up with an HCV RNA test to support the diagnosis of active HCV infection.   HSV 1 and 2 Ab, IgG     Status: None   Collection Time: 05/10/23  2:57 PM  Result Value Ref Range   HSV 1 Glycoprotein G Ab, IgG Non Reactive Non Reactive    Comment: **Please note reference interval change** HSV-1 IgG testing performed using the Roche Elecsys HSV-1 IgG assay.    HSV 2 IgG, Type Spec Non Reactive Non Reactive    Comment: **Please note reference interval change** Current guidelines and recommendations do not recommend routine screening for HSV-2 in asymptomatic individuals, including those that are pregnant. The detection of HSV-2 IgG antibodies in a single sample indicates previous exposure to HSV-2 but does not give information as to the site of HSV infection or the  timing of exposure. The predictive value of positive and negative results depends on the population's prevalence and the pretest likelihood of HSV-2. HSV-2 IgG testing performed using the Roche Elecsys HSV-2 IgG assay.   HPV, cobas high-risk/16/18     Status: Abnormal   Collection Time: 05/10/23  3:32 PM  Result Value Ref Range   HPV other hr types Positive (A) Negative   HPV 16 Negative Negative   HPV 18 Negative Negative    Comment: This nucleic acid amplification test detects fourteen high-risk HPV types: HPV16, HPV18 and twelve other high-risk types (68,66,64,60,54,48,47,43,41,40,33,31) without differentiation.         Assessment & Plan:   Assessment & Plan Barrett's esophagus with low grade dysplasia Food intolerance in adult Sending referral for [facility].  Patient has specifically asked for Claretta LITTIE Gallus MD, as she has experience with the specific conditions the patient is concerned about.   Vitamin D  deficiency, unspecified B12 deficiency due to diet Iron deficiency Postural dizziness with presyncope Paresthesias Anti-TPO antibodies present Nonspecific abnormal results of thyroid  function study Checking labs today.  Will continue supplements as needed.   Orders Placed This Encounter  Procedures   Tryptase   Vitamin D  (25 hydroxy)   Iron and IBC ([id number])   CMP14+EGFR   Vitamin B12   Anti-TPO Ab (RDL)   TSH+T4F+T3Free      No follow-ups on file.   Total time spent: 20 minutes  ALAN CHRISTELLA ARRANT, FNP  07/14/2023   This document may have been prepared by [facility] Voice Recognition software and as such may include unintentional dictation errors.

## 2023-07-18 LAB — CMP14+EGFR
ALT: 12 IU/L (ref 0–32)
AST: 19 IU/L (ref 0–40)
Albumin: 4.3 g/dL (ref 3.9–4.9)
Alkaline Phosphatase: 46 IU/L (ref 44–121)
BUN/Creatinine Ratio: 15 (ref 9–23)
BUN: 13 mg/dL (ref 6–20)
Bilirubin Total: 0.3 mg/dL (ref 0.0–1.2)
CO2: 22 mmol/L (ref 20–29)
Calcium: 9.5 mg/dL (ref 8.7–10.2)
Chloride: 104 mmol/L (ref 96–106)
Creatinine, Ser: 0.84 mg/dL (ref 0.57–1.00)
Globulin, Total: 3.1 g/dL (ref 1.5–4.5)
Glucose: 86 mg/dL (ref 70–99)
Potassium: 4 mmol/L (ref 3.5–5.2)
Sodium: 139 mmol/L (ref 134–144)
Total Protein: 7.4 g/dL (ref 6.0–8.5)
eGFR: 91 mL/min/{1.73_m2} (ref >59)

## 2023-07-18 LAB — TSH+T4F+T3FREE
Free T4: 1.21 ng/dL (ref 0.82–1.77)
T3, Free: 2.7 pg/mL (ref 2.0–4.4)
TSH: 2.14 u[IU]/mL (ref 0.450–4.500)

## 2023-07-18 LAB — VITAMIN D 25 HYDROXY (VIT D DEFICIENCY, FRACTURES): Vit D, 25-Hydroxy: 44.9 ng/mL (ref 30.0–100.0)

## 2023-07-18 LAB — VITAMIN B12: Vitamin B-12: 1127 pg/mL (ref 232–1245)

## 2023-07-18 LAB — TRYPTASE: Tryptase: 5.2 ug/L (ref 2.2–13.2)

## 2023-07-18 LAB — ANTI-TPO AB (RDL): Anti-TPO Ab (RDL): 403.3 [IU]/mL — ABNORMAL HIGH (ref <9.0)

## 2023-07-18 LAB — IRON AND TIBC
Iron Saturation: 24 % (ref 15–55)
Iron: 76 ug/dL (ref 27–159)
Total Iron Binding Capacity: 321 ug/dL (ref 250–450)
UIBC: 245 ug/dL (ref 131–425)

## 2023-07-21 ENCOUNTER — Ambulatory Visit: Payer: Self-pay

## 2023-07-28 ENCOUNTER — Encounter: Payer: Self-pay | Admitting: Family

## 2023-07-28 NOTE — Assessment & Plan Note (Signed)
 Checking labs today.  Will continue supplements as needed.   Orders Placed This Encounter  Procedures   Tryptase   Vitamin D  (25 hydroxy)   Iron and IBC ([id number])   CMP14+EGFR   Vitamin B12   Anti-TPO Ab (RDL)   TSH+T4F+T3Free

## 2023-07-28 NOTE — Assessment & Plan Note (Signed)
 Sending referral for [facility].  Patient has specifically asked for Claretta LITTIE Gallus MD, as she has experience with the specific conditions the patient is concerned about.

## 2023-07-29 ENCOUNTER — Encounter: Payer: Self-pay | Admitting: Family

## 2023-08-08 ENCOUNTER — Telehealth: Payer: Self-pay | Admitting: Neurology

## 2023-08-08 NOTE — Telephone Encounter (Signed)
 Pt. Tried to do MRI but was not able to complete would like Rx to calm down for Procedure

## 2023-08-08 NOTE — Telephone Encounter (Signed)
 Called and spoke to patient and informed here that her MRI was ordered almost a year ago and we would need to get hat rescheduled for her. Patient informed me that she is claustrophobic and is wondering if maybe a CT would be enough? Or even an open MRI? I did inform patient that we do have an open MRI as an option.   I informed patient and I am going to send a note to Dr. Tobie to see what she would like to do since it has been almost a year since patient has been seen and since she had her MRI order placed. Patient stated life got hectic and she didn't have time to get imaging done but she is ready now.   Patient aware I will contact her back.

## 2023-08-08 NOTE — Telephone Encounter (Signed)
 Called patient and informed her that per Dr. Tobie patient will need a follow up appt. New PA and updated notes are needed. Patient verbalized understanding and is aware someone from our office will give her a call back.

## 2023-10-01 ENCOUNTER — Emergency Department

## 2023-10-01 ENCOUNTER — Emergency Department
Admission: EM | Admit: 2023-10-01 | Discharge: 2023-10-01 | Disposition: A | Discharge status: Home / Self Care | Attending: Emergency Medicine | Admitting: Emergency Medicine

## 2023-10-01 ENCOUNTER — Other Ambulatory Visit: Payer: Self-pay

## 2023-10-01 DIAGNOSIS — R0602 Shortness of breath: Secondary | ICD-10-CM | POA: Insufficient documentation

## 2023-10-01 DIAGNOSIS — R079 Chest pain, unspecified: Secondary | ICD-10-CM

## 2023-10-01 DIAGNOSIS — R0789 Other chest pain: Secondary | ICD-10-CM | POA: Insufficient documentation

## 2023-10-01 LAB — CBC WITH DIFFERENTIAL/PLATELET
Abs Immature Granulocytes: 0.02 K/uL (ref 0.00–0.07)
Basophils Absolute: 0.1 K/uL (ref 0.0–0.1)
Basophils Relative: 1 %
Eosinophils Absolute: 0.1 K/uL (ref 0.0–0.5)
Eosinophils Relative: 1 %
HCT: 38.9 % (ref 36.0–46.0)
Hemoglobin: 12.9 g/dL (ref 12.0–15.0)
Immature Granulocytes: 0 %
Lymphocytes Relative: 35 %
Lymphs Abs: 2.6 K/uL (ref 0.7–4.0)
MCH: 31.4 pg (ref 26.0–34.0)
MCHC: 33.2 g/dL (ref 30.0–36.0)
MCV: 94.6 fL (ref 80.0–100.0)
Monocytes Absolute: 0.6 K/uL (ref 0.1–1.0)
Monocytes Relative: 8 %
Neutro Abs: 4.1 K/uL (ref 1.7–7.7)
Neutrophils Relative %: 55 %
Platelets: 232 K/uL (ref 150–400)
RBC: 4.11 MIL/uL (ref 3.87–5.11)
RDW: 12.7 % (ref 11.5–15.5)
WBC: 7.4 K/uL (ref 4.0–10.5)
nRBC: 0 % (ref 0.0–0.2)

## 2023-10-01 LAB — PREGNANCY, URINE: Preg Test, Ur: NEGATIVE

## 2023-10-01 LAB — RESP PANEL BY RT-PCR (RSV, FLU A&B, COVID)  RVPGX2
Influenza A by PCR: NEGATIVE
Influenza B by PCR: NEGATIVE
Resp Syncytial Virus by PCR: NEGATIVE
SARS Coronavirus 2 by RT PCR: NEGATIVE

## 2023-10-01 LAB — COMPREHENSIVE METABOLIC PANEL WITH GFR
ALT: 15 U/L (ref 0–44)
AST: 20 U/L (ref 15–41)
Albumin: 3.7 g/dL (ref 3.5–5.0)
Alkaline Phosphatase: 35 U/L — ABNORMAL LOW (ref 38–126)
Anion gap: 7 (ref 5–15)
BUN: 11 mg/dL (ref 6–20)
CO2: 24 mmol/L (ref 22–32)
Calcium: 9 mg/dL (ref 8.9–10.3)
Chloride: 107 mmol/L (ref 98–111)
Creatinine, Ser: 0.76 mg/dL (ref 0.44–1.00)
GFR, Estimated: >60 mL/min (ref >60)
Glucose, Bld: 112 mg/dL — ABNORMAL HIGH (ref 70–99)
Potassium: 3.7 mmol/L (ref 3.5–5.1)
Sodium: 138 mmol/L (ref 135–145)
Total Bilirubin: 0.4 mg/dL (ref 0.0–1.2)
Total Protein: 7.4 g/dL (ref 6.5–8.1)

## 2023-10-01 LAB — D-DIMER, QUANTITATIVE: D-Dimer, Quant: 0.78 ug{FEU}/mL — ABNORMAL HIGH (ref 0.00–0.50)

## 2023-10-01 LAB — MAGNESIUM: Magnesium: 1.9 mg/dL (ref 1.7–2.4)

## 2023-10-01 LAB — TROPONIN I (HIGH SENSITIVITY): Troponin I (High Sensitivity): <2 ng/L (ref <18)

## 2023-10-01 MED ORDER — IOHEXOL 350 MG/ML SOLN
75.0000 mL | Freq: Once | INTRAVENOUS | Status: AC | PRN
Start: 2023-10-01 — End: 2023-10-01
  Administered 2023-10-01: 75 mL via INTRAVENOUS

## 2023-10-01 NOTE — ED Triage Notes (Signed)
 Pt to ED for SOB, chest pressure, lightheadedness intermittent since Wednesday. Also sneezing and had 3 loose stools today. Also nausea and feeling cold and hot. Hx PMDD with similar symptoms sometimes, period due yesterday. Symptoms right now: stomach feels irritated, legs feel weak, and nauseous. No chest pressure or SOB right now. Home covid test negative. Pt took childrens chewable allergy med this AM. Skin dry, respirations unlabored.

## 2023-10-01 NOTE — Discharge Instructions (Signed)
 Your testing today was fortunately overall reassuring.  Follow-up your primary care doctor for further evaluation.  Return to the ER for new or worsening symptoms.

## 2023-10-01 NOTE — ED Notes (Signed)
 Patient transported to CT

## 2023-10-01 NOTE — ED Notes (Signed)
 Lab notified to run Urine Preg.

## 2023-10-01 NOTE — ED Provider Notes (Signed)
 [facility] Provider Note    Event Date/Time   First MD Initiated Contact with Patient 10/01/23 1135     (approximate)   History   Nausea and multiple complaints   HPI  Cynthia Mcintosh is a 38 year old female presenting to the ER for evaluation of shortness of breath with multiple associated symptoms.  On Wednesday patient had onset of shortness of breath with chest tightness, nausea.  Improved for a few days, but had recurrent symptoms today.  Says she has had similar symptoms in the past with COVID.  Does additionally note history of PMDD and has had similar symptoms related to her menstrual cycle which she is due for.  Symptoms currently improved.    Physical Exam   Triage Vital Signs: ED Triage Vitals  Encounter Vitals Group     BP 10/01/23 1059 (!) 139/102     Girls Systolic BP Percentile --      Girls Diastolic BP Percentile --      Boys Systolic BP Percentile --      Boys Diastolic BP Percentile --      Pulse Rate 10/01/23 1102 (!) 109     Resp 10/01/23 1059 16     Temp 10/01/23 1103 98 F (36.7 C)     Temp Source 10/01/23 1059 Oral     SpO2 10/01/23 1102 100 %     Weight --      Height --      Head Circumference --      Peak Flow --      Pain Score 10/01/23 1100 0     Pain Loc --      Pain Education --      Exclude from Growth Chart --     Most recent vital signs: Vitals:   10/01/23 1102 10/01/23 1103  BP:    Pulse: (!) 109   Resp:    Temp:  98 F (36.7 C)  SpO2: 100%      General: Awake, interactive  CV:  Mild tachycardia, good peripheral perfusion Resp:  Unlabored respirations, lungs clear to auscultation Abd:  Nondistended, soft, nontender Neuro:  Symmetric facial movement, fluid speech   ED Results / Procedures / Treatments   Labs (all labs ordered are listed, but only abnormal results are displayed) Labs Reviewed  COMPREHENSIVE METABOLIC PANEL WITH GFR - Abnormal; Notable for the following components:      Result  Value   Glucose, Bld 112 (*)    Alkaline Phosphatase 35 (*)    All other components within normal limits  D-DIMER, QUANTITATIVE (NOT AT [facility]) - Abnormal; Notable for the following components:   D-Dimer, Quant 0.78 (*)    All other components within normal limits  RESP PANEL BY RT-PCR (RSV, FLU A&B, COVID)  [id number]  CBC WITH DIFFERENTIAL/PLATELET  MAGNESIUM  PREGNANCY, URINE  TROPONIN I (HIGH SENSITIVITY)     EKG EKG independently reviewed and interpreted by myself demonstrates:  EKG demonstrates normal sinus rhythm at a rate of 93, PR 148, QRS 66, QTc 407, no acute ST changes  RADIOLOGY Imaging independently reviewed and interpreted by myself demonstrates:  CXR without focal consolidation CTA without PE  Formal Radiology Read:  CT Angio Chest PE W and/or Wo Contrast Result Date: 10/01/2023 CLINICAL DATA:  Chest tightness and shortness of breath. EXAM: CT ANGIOGRAPHY CHEST WITH CONTRAST TECHNIQUE: Multidetector CT imaging of the chest was performed using the standard protocol during bolus administration of intravenous contrast. Multiplanar CT image reconstructions  and MIPs were obtained to evaluate the vascular anatomy. RADIATION DOSE REDUCTION: This exam was performed according to the departmental dose-optimization program which includes automated exposure control, adjustment of the mA and/or kV according to patient size and/or use of iterative reconstruction technique. CONTRAST:  75mL OMNIPAQUE  IOHEXOL  350 MG/ML SOLN COMPARISON:  Radiograph of same day. FINDINGS: Cardiovascular: Satisfactory opacification of the pulmonary arteries to the segmental level. No evidence of pulmonary embolism. Normal heart size. No pericardial effusion. Mediastinum/Nodes: No enlarged mediastinal, hilar, or axillary lymph nodes. Thyroid  gland, trachea, and esophagus demonstrate no significant findings. Lungs/Pleura: Lungs are clear. No pleural effusion or pneumothorax. Upper Abdomen: No acute abnormality.  Musculoskeletal: No chest wall abnormality. No acute or significant osseous findings. Review of the MIP images confirms the above findings. IMPRESSION: No definite evidence of pulmonary embolus. No acute abnormality seen in the chest. Electronically Signed   By: Lynwood Landy Raddle M.D.   On: 10/01/2023 14:26   DG Chest 2 View Result Date: 10/01/2023 CLINICAL DATA:  Shortness of breath. EXAM: CHEST - 2 VIEW COMPARISON:  January 23, 2022. FINDINGS: The heart size and mediastinal contours are within normal limits. Both lungs are clear. The visualized skeletal structures are unremarkable. IMPRESSION: No active cardiopulmonary disease. Electronically Signed   By: Lynwood Landy Raddle M.D.   On: 10/01/2023 12:17    PROCEDURES:  Critical Care performed: No  Procedures   MEDICATIONS ORDERED IN ED: Medications  iohexol  (OMNIPAQUE ) 350 MG/ML injection 75 mL (75 mLs Intravenous Contrast Given 10/01/23 1409)     IMPRESSION / MDM / ASSESSMENT AND PLAN / ED COURSE  I reviewed the triage vital signs and the nursing notes.  Differential diagnosis includes, but is not limited to, anemia, electrolyte abnormality, ACS, pneumothorax, viral illness including COVID, sequelae related to known history of PMDD or MCAS  Patient's presentation is most consistent with acute presentation with potential threat to life or bodily function.  38 year old female presenting with shortness of breath.  Mild tachycardia on presentation.  Labs with reassuring CBC, CMP.  Negative troponin and greater than 3 hours of symptoms.  Normal magnesium.  Will obtain D-dimer, viral swab.  Chest x-Mazey Mantell reassuring.  EKG reassuring.  Viral swab returned negative.  D-dimer mildly elevated.  CTA ordered in the setting of this.  This was fortunately without evidence of PE.  Patient reassessed and updated on results of workup.  Low suspicion emergent process.  She is comfortable with discharge home.  Strict return precautions provided.  She was discharged  stable condition.    FINAL CLINICAL IMPRESSION(S) / ED DIAGNOSES   Final diagnoses:  Shortness of breath  Nonspecific chest pain     Rx / DC Orders   ED Discharge Orders     None        Note:  This document was prepared using Dragon voice recognition software and may include unintentional dictation errors.   Levander Slate, MD 10/01/23 [phone]

## 2023-10-01 NOTE — ED Notes (Addendum)
 Patient transported to X-ray

## 2023-10-12 ENCOUNTER — Ambulatory Visit: Admitting: Family

## 2023-10-12 VITALS — BP 112/72 | HR 88 | Ht 67.0 in | Wt 162.6 lb

## 2023-10-12 DIAGNOSIS — Z0130 Encounter for examination of blood pressure without abnormal findings: Secondary | ICD-10-CM

## 2023-10-12 DIAGNOSIS — M2550 Pain in unspecified joint: Secondary | ICD-10-CM | POA: Diagnosis not present

## 2023-10-12 DIAGNOSIS — R7303 Prediabetes: Secondary | ICD-10-CM

## 2023-10-12 NOTE — Progress Notes (Signed)
 Established Patient Office Visit  Subjective:  Patient ID: Cynthia Mcintosh, female    DOB: 1985/11/17  Age: 38 y.o. MRN: [id number]  Chief Complaint  Patient presents with   Follow-up    ER follow up    Patient here today for follow up.  She went to the ED with shortness of breath, lightheadedness.  Says that she is feeling better, but does have some concerns regarding her joints and aches and pains she has been having.   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  Asks if it's possible she might have EDS.   No other concerns at this time.   Past Medical History:  Diagnosis Date   Anxiety    Asthma 05/06/2019   COVID    Major depressive disorder, single episode, moderate (HCC) 10/24/2022    Past Surgical History:  Procedure Laterality Date   TONSILECTOMY/ADENOIDECTOMY WITH MYRINGOTOMY      Social History   Socioeconomic History   Marital status: Single    Spouse name: Not on file   Number of children: Not on file   Years of education: Not on file   Highest education level: Not on file  Occupational History   Not on file  Tobacco Use   Smoking status: Never   Smokeless tobacco: Never  Vaping Use   Vaping status: Never Used  Substance and Sexual Activity   Alcohol use: Not Currently   Drug use: Not Currently   Sexual activity: Yes    Birth control/protection: Pill  Other Topics Concern   Not on file  Social History Narrative   Are you right handed or left handed? Left   Are you currently employed ?    What is your current occupation? [employer]   Do you live at home alone?yes   Who lives with you?    What type of home do you live in: 1 story or 2 story? two    Caffeine none    Social Drivers of Corporate investment banker Strain: Low Risk  (07/10/2021)   Received from [facility]   Overall Financial Resource Strain (CARDIA)    Difficulty of Paying Living Expenses: Not  hard at all  Food Insecurity: No Food Insecurity (07/10/2021)   Received from [facility]   Hunger Vital Sign    Within the past 12 months, you worried that your food would run out before you got the money to buy more.: Never true    Within the past 12 months, the food you bought just didn't last and you didn't have money to get more.: Never true  Transportation Needs: No Transportation Needs (07/10/2021)   Received from [facility]   PRAPARE - Transportation    Lack of Transportation (Medical): No    Lack of Transportation (Non-Medical): No  Physical Activity: Sufficiently Active (07/10/2021)   Received from [facility]   Exercise Vital Sign    On average, how many days per week do you engage in moderate to strenuous exercise (like a brisk walk)?: 5 days    On average, how many minutes do you engage in exercise at this level?: 30 min  Stress: Stress Concern Present (07/10/2021)   Received from [facility] of Occupational Health - Occupational Stress Questionnaire    Feeling of Stress : Rather much  Social Connections: Moderately Isolated (07/10/2021)  Received from [facility]   Social Connection and Isolation Panel    In a typical week, how many times do you talk on the phone with family, friends, or neighbors?: More than three times a week    How often do you get together with friends or relatives?: More than three times a week    How often do you attend church or religious services?: 1 to 4 times per year    Do you belong to any clubs or organizations such as church groups, unions, fraternal or athletic groups, or school groups?: No    How often do you attend meetings of the clubs or organizations you belong to?: Never    Are you married, widowed, divorced, separated, never married, or living with a partner?: Never married  Intimate Partner Violence: Not At Risk (07/10/2021)   Received from [facility]   Humiliation, Afraid, Rape, and Kick  questionnaire    Within the last year, have you been afraid of your partner or ex-partner?: No    Within the last year, have you been humiliated or emotionally abused in other ways by your partner or ex-partner?: No    Within the last year, have you been kicked, hit, slapped, or otherwise physically hurt by your partner or ex-partner?: No    Within the last year, have you been raped or forced to have any kind of sexual activity by your partner or ex-partner?: No    Family History  Problem Relation Age of Onset   Hypertension Mother    Hypercholesterolemia Mother    Hypertension Father    Thyroid  disease Father    COPD Maternal Grandmother    Diabetes Maternal Grandfather    Hypertension Maternal Grandfather    Stroke Maternal Grandfather     No Known Allergies  Review of Systems  Gastrointestinal:  Positive for abdominal pain.  Musculoskeletal:  Positive for back pain, joint pain, myalgias and neck pain.  Psychiatric/Behavioral:  The patient is nervous/anxious.   All other systems reviewed and are negative.      Objective:   BP 112/72   Pulse 88   Ht 5' 7 (1.702 m)   Wt 162 lb 9.6 oz (73.8 kg)   LMP 10/01/2023 (Approximate)   SpO2 97%   BMI 25.47 kg/m   Vitals:   10/12/23 1450  BP: 112/72  Pulse: 88  Height: 5' 7 (1.702 m)  Weight: 162 lb 9.6 oz (73.8 kg)  SpO2: 97%  BMI (Calculated): 25.46    Physical Exam Vitals and nursing note reviewed.  Constitutional:      Appearance: Normal appearance. She is normal weight.  HENT:     Head: Normocephalic.  Eyes:     Extraocular Movements: Extraocular movements intact.     Conjunctiva/sclera: Conjunctivae normal.     Pupils: Pupils are equal, round, and reactive to light.  Cardiovascular:     Rate and Rhythm: Normal rate.  Pulmonary:     Effort: Pulmonary effort is normal.  Neurological:     General: No focal deficit present.     Mental Status: She is alert and oriented to person, place, and time. Mental  status is at baseline.  Psychiatric:        Mood and Affect: Mood normal.        Behavior: Behavior normal.        Thought Content: Thought content normal.        Judgment: Judgment normal.      No results  found for any visits on 10/12/23.  Recent Results (from the past 2160 hours)  CBC with Differential     Status: None   Collection Time: 10/01/23 11:14 AM  Result Value Ref Range   WBC 7.4 4.0 - 10.5 K/uL   RBC 4.11 3.87 - 5.11 MIL/uL   Hemoglobin 12.9 12.0 - 15.0 g/dL   HCT 61.0 63.9 - 53.9 %   MCV 94.6 80.0 - 100.0 fL   MCH 31.4 26.0 - 34.0 pg   MCHC 33.2 30.0 - 36.0 g/dL   RDW 87.2 88.4 - 84.4 %   Platelets 232 150 - 400 K/uL   nRBC 0.0 0.0 - 0.2 %   Neutrophils Relative % 55 %   Neutro Abs 4.1 1.7 - 7.7 K/uL   Lymphocytes Relative 35 %   Lymphs Abs 2.6 0.7 - 4.0 K/uL   Monocytes Relative 8 %   Monocytes Absolute 0.6 0.1 - 1.0 K/uL   Eosinophils Relative 1 %   Eosinophils Absolute 0.1 0.0 - 0.5 K/uL   Basophils Relative 1 %   Basophils Absolute 0.1 0.0 - 0.1 K/uL   Immature Granulocytes 0 %   Abs Immature Granulocytes 0.02 0.00 - 0.07 K/uL    Comment: Performed at [facility], [address] [place], [place] [address]  Comprehensive metabolic panel     Status: Abnormal   Collection Time: 10/01/23 11:14 AM  Result Value Ref Range   Sodium 138 135 - 145 mmol/L   Potassium 3.7 3.5 - 5.1 mmol/L   Chloride 107 98 - 111 mmol/L   CO2 24 22 - 32 mmol/L   Glucose, Bld 112 (H) 70 - 99 mg/dL    Comment: Glucose reference range applies only to samples taken after fasting for at least 8 hours.   BUN 11 6 - 20 mg/dL   Creatinine, Ser 9.23 0.44 - 1.00 mg/dL   Calcium 9.0 8.9 - 89.6 mg/dL   Total Protein 7.4 6.5 - 8.1 g/dL   Albumin 3.7 3.5 - 5.0 g/dL   AST 20 15 - 41 U/L   ALT 15 0 - 44 U/L   Alkaline Phosphatase 35 (L) 38 - 126 U/L   Total Bilirubin 0.4 0.0 - 1.2 mg/dL   GFR, Estimated >39 >39 mL/min    Comment: (NOTE) Calculated using the CKD-EPI  Creatinine Equation (2021)    Anion gap 7 5 - 15    Comment: Performed at [facility], [address], [place], [place] [address]  Troponin I (High Sensitivity)     Status: None   Collection Time: 10/01/23 11:14 AM  Result Value Ref Range   Troponin I (High Sensitivity) <2 <18 ng/L    Comment: (NOTE) Elevated high sensitivity troponin I (hsTnI) values and significant  changes across serial measurements may suggest ACS but many other  chronic and acute conditions are known to elevate hsTnI results.  Refer to the Links section for chest pain algorithms and additional  guidance. Performed at [facility], [address] [place], [place] [address]   Magnesium     Status: None   Collection Time: 10/01/23 11:14 AM  Result Value Ref Range   Magnesium 1.9 1.7 - 2.4 mg/dL    Comment: Performed at [facility], [address] [place], [place] [address]  Pregnancy, urine     Status: None   Collection Time: 10/01/23 12:04 PM  Result Value Ref Range   Preg Test, Ur NEGATIVE NEGATIVE    Comment:  THE SENSITIVITY OF THIS METHODOLOGY IS >20 mIU/mL. Performed at [facility], [address] [place], [place] [address]   Resp panel by RT-PCR (RSV, Flu A&B, Covid) Anterior Nasal Swab     Status: None   Collection Time: 10/01/23 12:39 PM   Specimen: Anterior Nasal Swab  Result Value Ref Range   SARS Coronavirus 2 by RT PCR NEGATIVE NEGATIVE    Comment: (NOTE) SARS-CoV-2 target nucleic acids are NOT DETECTED.  The SARS-CoV-2 RNA is generally detectable in upper respiratory specimens during the acute phase of infection. The lowest concentration of SARS-CoV-2 viral copies this assay can detect is 138 copies/mL. A negative result does not preclude SARS-Cov-2 infection and should not be used as the sole basis for treatment or other patient management decisions. A negative result may occur with  improper specimen collection/handling, submission of  specimen other than nasopharyngeal swab, presence of viral mutation(s) within the areas targeted by this assay, and inadequate number of viral copies(<138 copies/mL). A negative result must be combined with clinical observations, patient history, and epidemiological information. The expected result is Negative.  Fact Sheet for Patients:  BloggerCourse.com  Fact Sheet for Healthcare Providers:  SeriousBroker.it  This test is no t yet approved or cleared by the [place]  FDA and  has been authorized for detection and/or diagnosis of SARS-CoV-2 by FDA under an Emergency Use Authorization (EUA). This EUA will remain  in effect (meaning this test can be used) for the duration of the COVID-19 declaration under Section 564(b)(1) of the Act, 21 U.S.C.section 360bbb-3(b)(1), unless the authorization is terminated  or revoked sooner.       Influenza A by PCR NEGATIVE NEGATIVE   Influenza B by PCR NEGATIVE NEGATIVE    Comment: (NOTE) The Xpert Xpress SARS-CoV-2/FLU/RSV plus assay is intended as an aid in the diagnosis of influenza from Nasopharyngeal swab specimens and should not be used as a sole basis for treatment. Nasal washings and aspirates are unacceptable for Xpert Xpress SARS-CoV-2/FLU/RSV testing.  Fact Sheet for Patients: BloggerCourse.com  Fact Sheet for Healthcare Providers: SeriousBroker.it  This test is not yet approved or cleared by the [place]  FDA and has been authorized for detection and/or diagnosis of SARS-CoV-2 by FDA under an Emergency Use Authorization (EUA). This EUA will remain in effect (meaning this test can be used) for the duration of the COVID-19 declaration under Section 564(b)(1) of the Act, 21 U.S.C. section 360bbb-3(b)(1), unless the authorization is terminated or revoked.     Resp Syncytial Virus by PCR NEGATIVE NEGATIVE    Comment:  (NOTE) Fact Sheet for Patients: BloggerCourse.com  Fact Sheet for Healthcare Providers: SeriousBroker.it  This test is not yet approved or cleared by the [place]  FDA and has been authorized for detection and/or diagnosis of SARS-CoV-2 by FDA under an Emergency Use Authorization (EUA). This EUA will remain in effect (meaning this test can be used) for the duration of the COVID-19 declaration under Section 564(b)(1) of the Act, 21 U.S.C. section 360bbb-3(b)(1), unless the authorization is terminated or revoked.  Performed at [facility], [address] [place], [place] [address]   D-dimer, quantitative     Status: Abnormal   Collection Time: 10/01/23 12:39 PM  Result Value Ref Range   D-Dimer, Quant 0.78 (H) 0.00 - 0.50 ug/mL-FEU    Comment: (NOTE) At the manufacturer cut-off value of 0.5 g/mL FEU, this assay has a negative predictive value of 95-100%.This assay is intended for use in conjunction with a clinical pretest  probability (PTP) assessment model to exclude pulmonary embolism (PE) and deep venous thrombosis (DVT) in outpatients suspected of PE or DVT. Results should be correlated with clinical presentation. Performed at [facility], [address], [place], [place] [address]        Assessment & Plan Hypermobility arthralgia EDS panel ordered today.  Will call with results when available.     Return as needed.   Total time spent: 20 minutes  ALAN CHRISTELLA ARRANT, FNP  10/12/2023   This document may have been prepared by [facility] Voice Recognition software and as such may include unintentional dictation errors.

## 2023-10-13 ENCOUNTER — Ambulatory Visit: Attending: Cardiology | Admitting: Cardiology

## 2023-10-13 ENCOUNTER — Encounter: Payer: Self-pay | Admitting: Family

## 2023-10-13 ENCOUNTER — Encounter: Payer: Self-pay | Admitting: Cardiology

## 2023-10-13 ENCOUNTER — Ambulatory Visit (INDEPENDENT_AMBULATORY_CARE_PROVIDER_SITE_OTHER)

## 2023-10-13 VITALS — BP 120/70 | HR 82 | Ht 67.0 in | Wt 161.8 lb

## 2023-10-13 DIAGNOSIS — R002 Palpitations: Secondary | ICD-10-CM | POA: Diagnosis not present

## 2023-10-13 DIAGNOSIS — I479 Paroxysmal tachycardia, unspecified: Secondary | ICD-10-CM

## 2023-10-13 DIAGNOSIS — R079 Chest pain, unspecified: Secondary | ICD-10-CM | POA: Diagnosis not present

## 2023-10-13 DIAGNOSIS — J45909 Unspecified asthma, uncomplicated: Secondary | ICD-10-CM

## 2023-10-13 DIAGNOSIS — Z87898 Personal history of other specified conditions: Secondary | ICD-10-CM

## 2023-10-13 DIAGNOSIS — F419 Anxiety disorder, unspecified: Secondary | ICD-10-CM

## 2023-10-13 NOTE — Telephone Encounter (Signed)
 Printed for competition

## 2023-10-13 NOTE — Progress Notes (Signed)
 Cardiology Office Note   Date:  10/13/2023  ID:  Cynthia Mcintosh, DOB 10-14-1985, MRN [id number] PCP: Orlean Alan HERO, FNP  [facility] Providers Cardiologist:  Evalene Lunger, MD     History of Present Illness Cynthia Mcintosh is a 38 y.o. female with a past medical history of palpitations, paroxysmal tachycardia, anxiety, moderate depression disorder, atypical chest tightness, asthma, anxiety, panic attacks, who is here today for follow-up after recent emergency department visit.  Was seen in clinic 10/14/2021 by Dr. Gollan with complaints of shortness of breath, chest discomfort/chest pressure and palpitations.  She previously suffered from a COVID infection in January 2022.  She also reports she was off of all of her antianxiety medications and that is when she started having increased frequency of tachycardia and chest tightness.  She was scheduled for an echocardiogram and heart monitor.   She was last seen in clinic 11/04/2022 stating that she had been doing well.  Denied any reoccurrence of chest tightness or shortness of breath.  Recent labs with PCP.  Had long discussion with the PCP the potential for long COVID was determined to be the origin of her symptoms.  There were no changes made to her medication regimen and no further testing that was ordered at that time.  Was evaluated in the [facility] emergency department on 10/01/23 with several complaints.  Blood pressure was suboptimally controlled at 139/102, heart rate of 109, respirations 16, temperature 98.  Recent CTA chest negative for PE found with an elevated D-dimer.  Labs were reassuring CBC, CMP, negative troponin and greater than 3 hours of symptoms.  Normal magnesium.  COVID/flu/RSV swab negative.  She returns to clinic today after recent visit to the emergency department stating that she is unsure of the chest discomfort shortness of breath palpitations that she was experiencing was related to a viral infection she was advised she  had RFA could have even been anxiety.  She states that she has some questions today as to whether it could be POTS although she does not have the postural tachycardia.  She was concerned about her elevated D-dimer even though chest CT was negative for PE.  States that she continues to have the palpitations primarily in the evening when she lies down.  Due to the anxiety and the symptoms that she has been having she states she had also recently had to have work accommodations that were made.  ROS: 10 point review of systems was reviewed and considered negative exception was been listed in the HPI  Studies Reviewed EKG Interpretation Date/Time:  Thursday October 13 2023 11:29:07 EDT Ventricular Rate:  82 PR Interval:  158 QRS Duration:  68 QT Interval:  360 QTC Calculation: 420 R Axis:   32  Text Interpretation: Normal sinus rhythm Nonspecific T wave abnormality When compared with ECG of 01-Oct-2023 12:49, No significant change was found Confirmed by Gerard Frederick ([id number]) on 10/13/2023 11:31:59 AM    Exercise tolerance test 10/15/2022 Exercise treadmill study Regular Bruce protocol Resting EKG with normal sinus rhythm rate 110 bpm blood pressure 105/87 Exercised for 5 minutes, achieved peak heart rate 181 bpm, 98% of maximum predicted heart rate Peak blood pressure 136/92 Achieved 7.0 METS No significant ST changes at peak stress or in recovery concerning for ischemia Heart rate down to 126 bpm at 4 minutes in recovery   Final impression Normal exercise treadmill study Sinus tachycardia at rest   TTE 11/28/20 1. Left ventricular ejection fraction, by estimation, is 60 to 65%.  The  left ventricle has normal function. The left ventricle has no regional  wall motion abnormalities. Left ventricular diastolic parameters were  normal. The average left ventricular  global longitudinal strain is -20.1 %. The global longitudinal strain is  normal.   2. Right ventricular systolic function is  normal. The right ventricular  size is normal. There is normal pulmonary artery systolic pressure. The  estimated right ventricular systolic pressure is 17.5 mmHg.   3. The mitral valve is normal in structure. Mild mitral valve  regurgitation. No evidence of mitral stenosis.   4. The aortic valve is normal in structure. Aortic valve regurgitation is  not visualized. No aortic stenosis is present.   5. The inferior vena cava is normal in size with greater than 50%  respiratory variability, suggesting right atrial pressure of 3 mmHg.    Heart Monitor 11/24/20 Normal sinus rhythm Patient had a min HR of 51 bpm, max HR of 189 bpm, and avg HR of 84 bpm.    3 Supraventricular Tachycardia runs occurred, the run with the fastest interval lasting 5 beats with a max rate of 179 bpm, the longest lasting 17 beats with an avg rate of 127 bpm.    Isolated SVEs were rare (<1.0%), SVE Couplets were rare (<1.0%), and SVE Triplets were rare (<1.0%).  Isolated VEs were rare (<1.0%), VE Couplets were rare (<1.0%), and no VE Triplets were present.    Patient triggered events not associated with significant arrhythmia Risk Assessment/Calculations           Physical Exam VS:  BP 120/70 (BP Location: Left Arm, Patient Position: Sitting, Cuff Size: Normal)   Pulse 82   Ht 5' 7 (1.702 m)   Wt 161 lb 12.8 oz (73.4 kg)   LMP 10/01/2023 (Approximate)   SpO2 100%   BMI 25.34 kg/m        Wt Readings from Last 3 Encounters:  10/13/23 161 lb 12.8 oz (73.4 kg)  10/12/23 162 lb 9.6 oz (73.8 kg)  07/14/23 157 lb 3.2 oz (71.3 kg)    GEN: Well nourished, well developed in no acute distress NECK: No JVD; No carotid bruits CARDIAC: RRR, no murmurs, rubs, gallops RESPIRATORY:  Clear to auscultation without rales, wheezing or rhonchi  ABDOMEN: Soft, non-tender, non-distended EXTREMITIES:  No edema; No deformity   ASSESSMENT AND PLAN Atypical chest pain that is now resolved recent workup in the emergency  department was unrevealing.  She had a normal treadmill study with sinus tachycardia noted.  EKG today reveals sinus rhythm with rate of 82 with nonspecific T wave send no acute ischemic changes noted.  She has been scheduled for coronary calcium score to further risk stratify.  Palpitations that have worsened.  Longstanding history of paroxysmal tachycardia.  Previously had 1 monitors that were not found.  Will repeat a ZIO XT monitor for 14 days.  Also will refer her to POTS clinic at [facility].  She is encouraged to continue to maintain adequate hydration and sodium loading that she has been doing.  With a longstanding history of asthma deferred adding beta-blocker therapy today as EKG reveals heart rate of 82.  Longstanding history of asthma which she continues to rescue inhaler.  She continues to be followed by her PCP for ongoing management.  Generalized anxiety disorder previously managed by her psychiatrist.  She is currently not on any antianxiety medications.  Encouraged her to continue to follow-up with PCP.       Dispo: Patient to return  to clinic to see MD/APP in 8 weeks or sooner after testing is completed for further evaluation.  Signed, Kimmy Totten, NP

## 2023-10-13 NOTE — Patient Instructions (Addendum)
 Ambulatory referral for POTS Clinic - [place]  Medication Instructions:  Your physician recommends that you continue on your current medications as directed. Please refer to the Current Medication list given to you today.   *If you need a refill on your cardiac medications before your next appointment, please call your pharmacy*  Lab Work: No labs ordered today  If you have labs (blood work) drawn today and your tests are completely normal, you will receive your results only by: MyChart Message (if you have MyChart) OR A paper copy in the mail If you have any lab test that is abnormal or we need to change your treatment, we will call you to review the results.  Testing/Procedures: Cynthia Mcintosh- Long Term Monitor Instructions  Your physician has requested you wear a ZIO patch monitor for 14 days.  This is a single patch monitor. [employer] supplies one patch monitor per enrollment. Additional stickers are not available. Please do not apply patch if you will be having a Nuclear Stress Test,  Echocardiogram, Cardiac CT, MRI, or Chest Xray during the period you would be wearing the  monitor. The patch cannot be worn during these tests. You cannot remove and re-apply the  ZIO XT patch monitor.  Your ZIO patch monitor will be mailed 3 day USPS to your address on file. It may take 3-5 days  to receive your monitor after you have been enrolled.  Once you have received your monitor, please review the enclosed instructions. Your monitor  has already been registered assigning a specific monitor serial # to you.  Billing and Patient Assistance Program Information  We have supplied [employer] with any of your insurance information on file for billing purposes. [employer] offers a sliding scale Patient Assistance Program for patients that do not have  insurance, or whose insurance does not completely cover the cost of the ZIO monitor.  You must apply for the Patient Assistance Program to qualify for this  discounted rate.  To apply, please call [employer] at [phone], select option 4, select option 2, ask to apply for  Patient Assistance Program. Meredeth will ask your household income, and how many people  are in your household. They will quote your out-of-pocket cost based on that information.  [employer] will also be able to set up a 48-month, interest-free payment plan if needed.  Applying the monitor   Shave hair from upper left chest.  Hold abrader disc by orange tab. Rub abrader in 40 strokes over the upper left chest as  indicated in your monitor instructions.  Clean area with 4 enclosed alcohol pads. Let dry.  Apply patch as indicated in monitor instructions. Patch will be placed under collarbone on left  side of chest with arrow pointing upward.  Rub patch adhesive wings for 2 minutes. Remove white label marked 1. Remove the white  label marked 2. Rub patch adhesive wings for 2 additional minutes.  While looking in a mirror, press and release button in center of patch. A small green light will  flash 3-4 times. This will be your only indicator that the monitor has been turned on.  Do not shower for the first 24 hours. You may shower after the first 24 hours.  Press the button if you feel a symptom. You will hear a small click. Record Date, Time and  Symptom in the Patient Logbook.  When you are ready to remove the patch, follow instructions on the last 2 pages of Patient  Logbook. Stick patch monitor onto  the last page of Patient Logbook.  Place Patient Logbook in the blue and white box. Use locking tab on box and tape box closed  securely. The blue and white box has prepaid postage on it. Please place it in the mailbox as  soon as possible. Your physician should have your test results approximately 7 days after the  monitor has been mailed back to [facility].  Call [facility] Customer Care at [phone] if you have questions regarding  your ZIO XT patch monitor. Call  them immediately if you see an orange light blinking on your  monitor.  If your monitor falls off in less than 4 days, contact our Monitor department at [phone].  If your monitor becomes loose or falls off after 4 days call [employer] at [phone] for  suggestions on securing your monitor  Your physician has recommended that you have CT Coronary Calcium Score.  - $99 out of pocket cost at the time of your test - Call [phone] to schedule at your convenience.  [facility] [address] [place], [place] [address]   Coronary CalciumScan A coronary calcium scan is an imaging test used to look for deposits of calcium and other fatty materials (plaques) in the inner lining of the blood vessels of the heart (coronary arteries). These deposits of calcium and plaques can partly clog and narrow the coronary arteries without producing any symptoms or warning signs. This puts a person at risk for a heart attack. This test can detect these deposits before symptoms develop. Tell a health care provider about: Any allergies you have. All medicines you are taking, including vitamins, herbs, eye drops, creams, and over-the-counter medicines. Any problems you or family members have had with anesthetic medicines. Any blood disorders you have. Any surgeries you have had. Any medical conditions you have. Whether you are pregnant or may be pregnant. What are the risks? Generally, this is a safe procedure. However, problems may occur, including: Harm to a pregnant woman and her unborn baby. This test involves the use of radiation. Radiation exposure can be dangerous to a pregnant woman and her unborn baby. If you are pregnant, you generally should not have this procedure done. Slight increase in the risk of cancer. This is because of the radiation involved in the test. What happens before the procedure? No preparation is needed for this procedure. What  happens during the procedure? You will undress and remove any jewelry around your neck or chest. You will put on a hospital gown. Sticky electrodes will be placed on your chest. The electrodes will be connected to an electrocardiogram (ECG) machine to record a tracing of the electrical activity of your heart. A CT scanner will take pictures of your heart. During this time, you will be asked to lie still and hold your breath for 2-3 seconds while a picture of your heart is being taken. The procedure may vary among health care providers and hospitals. What happens after the procedure? You can get dressed. You can return to your normal activities. It is up to you to get the results of your test. Ask your health care provider, or the department that is doing the test, when your results will be ready. Summary A coronary calcium scan is an imaging test used to look for deposits of calcium and other fatty materials (plaques) in the inner lining of the blood vessels of the heart (coronary arteries). Generally, this is a safe procedure. Tell your health care  provider if you are pregnant or may be pregnant. No preparation is needed for this procedure. A CT scanner will take pictures of your heart. You can return to your normal activities after the scan is done. This information is not intended to replace advice given to you by your health care provider. Make sure you discuss any questions you have with your health care provider. Document Released: 07/10/2007 Document Revised: 12/01/2015 Document Reviewed: 12/01/2015 Elsevier Interactive Patient Education  2017 [employer].   Follow-Up: At [facility], you and your health needs are our priority.  As part of our continuing mission to provide you with exceptional heart care, our providers are all part of one team.  This team includes your primary Cardiologist (physician) and Advanced Practice Providers or APPs (Physician Assistants and Nurse  Practitioners) who all work together to provide you with the care you need, when you need it.  Your next appointment:   2 month(s)  Provider:   You may see Timothy Gollan, MD or one of the following Advanced Practice Providers on your designated Care Team:   Lonni Meager, NP Lesley Maffucci, PA-C Bernardino Bring, PA-C Cadence Red Bud, PA-C Tylene Lunch, NP Barnie Hila, NP

## 2023-10-19 ENCOUNTER — Encounter: Payer: Self-pay | Admitting: Family

## 2023-10-24 ENCOUNTER — Telehealth: Payer: Self-pay | Admitting: Family

## 2023-10-24 NOTE — Telephone Encounter (Signed)
 Patient left VM to check the status of her accommodations paperwork for work. 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 Please advise on the status of this paperwork.

## 2023-11-03 NOTE — Telephone Encounter (Signed)
 Patient called and left another VM to check the status of her accommodations form. Please advise the status of this.

## 2023-11-04 NOTE — Telephone Encounter (Signed)
 Reprinted paperwork 11/03/23 am to get filled out

## 2023-11-07 LAB — EHLERS-DANLOS SYNDROME PANEL

## 2023-11-16 ENCOUNTER — Encounter: Payer: Self-pay | Admitting: Family

## 2023-11-17 ENCOUNTER — Telehealth: Payer: Self-pay
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
## 2023-11-18 NOTE — Telephone Encounter (Signed)
 Pt has LVM and sent multiple messages in regards to this, working on letter for pt

## 2023-11-21 ENCOUNTER — Encounter: Payer: Self-pay | Admitting: Family

## 2023-11-22 ENCOUNTER — Ambulatory Visit: Admitting: Family

## 2023-11-22 ENCOUNTER — Encounter: Payer: Self-pay | Admitting: Family

## 2023-11-22 VITALS — BP 122/78 | HR 92 | Ht 67.0 in | Wt 169.0 lb

## 2023-11-22 DIAGNOSIS — F3281 Premenstrual dysphoric disorder: Secondary | ICD-10-CM | POA: Diagnosis not present

## 2023-11-22 DIAGNOSIS — F419 Anxiety disorder, unspecified: Secondary | ICD-10-CM | POA: Diagnosis not present

## 2023-11-22 DIAGNOSIS — K2100 Gastro-esophageal reflux disease with esophagitis, without bleeding: Secondary | ICD-10-CM

## 2023-11-22 DIAGNOSIS — J4550 Severe persistent asthma, uncomplicated: Secondary | ICD-10-CM

## 2023-11-22 DIAGNOSIS — Z0130 Encounter for examination of blood pressure without abnormal findings: Secondary | ICD-10-CM

## 2023-11-22 DIAGNOSIS — F32A Depression, unspecified: Secondary | ICD-10-CM

## 2023-11-22 DIAGNOSIS — Z131 Encounter for screening for diabetes mellitus: Secondary | ICD-10-CM

## 2023-11-22 NOTE — Telephone Encounter (Signed)
 See other message, letter typed signed and patient informed

## 2023-11-25 ENCOUNTER — Ambulatory Visit: Payer: Self-pay

## 2023-11-25 NOTE — Telephone Encounter (Signed)
 Letter already completed and informed pt of this

## 2023-11-25 NOTE — Telephone Encounter (Signed)
 Taken care of

## 2023-11-27 DIAGNOSIS — R002 Palpitations: Secondary | ICD-10-CM | POA: Diagnosis not present

## 2023-11-29 ENCOUNTER — Ambulatory Visit: Payer: Self-pay | Admitting: Cardiology

## 2023-12-14 ENCOUNTER — Encounter: Payer: Self-pay | Admitting: Cardiology

## 2023-12-14 ENCOUNTER — Ambulatory Visit: Attending: Cardiology | Admitting: Cardiology

## 2023-12-14 VITALS — BP 112/60 | HR 100 | Ht 67.5 in | Wt 162.2 lb

## 2023-12-14 DIAGNOSIS — F419 Anxiety disorder, unspecified: Secondary | ICD-10-CM

## 2023-12-14 DIAGNOSIS — J45909 Unspecified asthma, uncomplicated: Secondary | ICD-10-CM

## 2023-12-14 DIAGNOSIS — R002 Palpitations: Secondary | ICD-10-CM

## 2023-12-14 NOTE — Progress Notes (Signed)
 Cardiology Office Note   Date:  12/14/2023  ID:  Dusti Tetro, DOB Sep 29, 1985, MRN [id number] PCP: Orlean Alan HERO, FNP  [facility] Providers Cardiologist:  Evalene Lunger, MD Cardiology APP:  Gerard Frederick, NP     History of Present Illness Cynthia Mcintosh is a 38 y.o. female with past medical history of palpitations, paroxysmal tachycardia, anxiety, moderate depression disorder, atypical chest tightness, asthma, anxiety, panic attacks, who is here today for for follow-up.   She was previously seen in clinic by Dr. Gollan 10/14/2021 with complaints of shortness of breath, chest discomfort/chest pressure, and palpitations.  She previously suffered from COVID infection in January 2022.  She reports that she was off of all her antianxiety medications since when she started having increased frequency of tachycardia and chest tightness.  She was scheduled for an echocardiogram and a heart monitor.   She was seen in clinic 11/04/2022 states she been doing well.  Denied any reoccurrence of chest tightness or shortness of breath.  Recent labs with PCP were unrevealing.  Long discussion with PCP for potential for long COVID was determined to be the origin of her symptoms.  There were no changes made to her medication regimen or further testing that was ordered at that time.  She was evaluated in the [facility] emergency department on 6/25 with several complaints.  Blood pressure was suboptimally controlled, heart rate was elevated at 109, respirations 16, temperature 98.  Recent CT of the chest was negative for PE as she was found to have an elevated D-dimer.  Labs are reassuring.  She was last seen in clinic 10/13/2023.  At this time she was unsure if her chest discomfort and shortness of breath was related to her palpitation she was really related to a viral infection she was advised that she had or could even be her anxiety.  She states she had some questions today as to whether it could be POTS  although she does not have postural tachycardia.  She was concerned about elevated D-dimer even though her chest CT was negative for PE.  States she continues to have palpitations primarily when she lies down.  Due to anxiety and symptoms she has been having she also recently had a work accommodations.  With her ongoing palpitation she was placed on a ZIO XT monitor for 14 days.  With her longstanding history of asthma beta-blocker therapy was deferred at that time.  She was also scheduled for calcium scoring to further risk stratify.   She returns to clinic today stating that she has been doing fairly well from a cardiac perspective.  She continues to have palpitations and elevated heart rates but not as significant.  She was previously referred to POTS clinic at [facility] but she had to hear back from her referral.  She also has a calcium score and is scheduled but has not been completed due to previously wearing heart monitor.  Did discuss potential role for beta-blocker therapy with a longstanding history of asthma we have to be careful with medications chosen.  She continues to suffer from anxiety but denies any recurrent chest discomfort.  She states that she has been having some changes at work.  She also notes an increase in energy and not feeling as fatigued since having a mildew issue taking her with her windows in her apartment.  She denies any recent hospitalizations or visits to the emergency department.  ROS: 10 point review of systems has been reviewed and considered negative with the  exception was been listed in the HPI  Studies Reviewed     Event monitor (Zio) 11/14/2023 Patch Wear Time:  13 days and 15 hours (2025-09-27T19:05:49-0400 to 2025-10-11T10:57:29-0400)   Normal sinus rhythm Patient had a min HR of 48 bpm, max HR of 214 bpm, and avg HR of 77 bpm.    4 Supraventricular Tachycardia runs occurred, the run with the fastest interval lasting 9 beats with a max rate of 214 bpm, the  longest lasting 15 beats with an avg rate of 188 bpm.    Isolated SVEs were rare (<1.0%), SVE Couplets were rare (<1.0%), and SVE Triplets were rare (<1.0%).    Isolated VEs were rare (<1.0%), VE Couplets were rare (<1.0%), and no VE Triplets were present.    Triggered events (6) associated with normal sinus rhythm, rare PAC, PVC  Exercise tolerance test 10/15/2022 Exercise treadmill study Regular Bruce protocol Resting EKG with normal sinus rhythm rate 110 bpm blood pressure 105/87 Exercised for 5 minutes, achieved peak heart rate 181 bpm, 98% of maximum predicted heart rate Peak blood pressure 136/92 Achieved 7.0 METS No significant ST changes at peak stress or in recovery concerning for ischemia Heart rate down to 126 bpm at 4 minutes in recovery   Final impression Normal exercise treadmill study Sinus tachycardia at rest   TTE 11/28/20 1. Left ventricular ejection fraction, by estimation, is 60 to 65%. The  left ventricle has normal function. The left ventricle has no regional  wall motion abnormalities. Left ventricular diastolic parameters were  normal. The average left ventricular  global longitudinal strain is -20.1 %. The global longitudinal strain is  normal.   2. Right ventricular systolic function is normal. The right ventricular  size is normal. There is normal pulmonary artery systolic pressure. The  estimated right ventricular systolic pressure is 17.5 mmHg.   3. The mitral valve is normal in structure. Mild mitral valve  regurgitation. No evidence of mitral stenosis.   4. The aortic valve is normal in structure. Aortic valve regurgitation is  not visualized. No aortic stenosis is present.   5. The inferior vena cava is normal in size with greater than 50%  respiratory variability, suggesting right atrial pressure of 3 mmHg.    Heart Monitor 11/24/20 Normal sinus rhythm Patient had a min HR of 51 bpm, max HR of 189 bpm, and avg HR of 84 bpm.    3  Supraventricular Tachycardia runs occurred, the run with the fastest interval lasting 5 beats with a max rate of 179 bpm, the longest lasting 17 beats with an avg rate of 127 bpm.    Isolated SVEs were rare (<1.0%), SVE Couplets were rare (<1.0%), and SVE Triplets were rare (<1.0%).  Isolated VEs were rare (<1.0%), VE Couplets were rare (<1.0%), and no VE Triplets were present.    Patient triggered events not associated with significant arrhythmia  Risk Assessment/Calculations           Physical Exam VS:  BP 112/60 (BP Location: Left Arm, Patient Position: Sitting, Cuff Size: Normal)   Pulse 100   Ht 5' 7.5 (1.715 m)   Wt 162 lb 3.2 oz (73.6 kg)   SpO2 99%   BMI 25.03 kg/m        Wt Readings from Last 3 Encounters:  12/14/23 162 lb 3.2 oz (73.6 kg)  11/22/23 169 lb (76.7 kg)  10/13/23 161 lb 12.8 oz (73.4 kg)    GEN: Well nourished, well developed in no acute distress NECK:  No JVD; No carotid bruits CARDIAC: RRR, no murmurs, rubs, gallops RESPIRATORY:  Clear to auscultation without rales, wheezing or rhonchi  ABDOMEN: Soft, non-tender, non-distended EXTREMITIES:  No edema; No deformity   ASSESSMENT AND PLAN Palpitations that have continued.  When she is active and working around she notices heart rate 150 and 170.  It does come back down in the 90s to 100s.  ZIO XT monitor revealed an average heart rate of 77 bpm and she had 4 supraventricular tachycardia runs with the fastest 1 increasing heart rate of 214 lasting 9 beats and the longest 1 was 15 beats with an average heart rate of 188 bpm, there were isolated SVE's and VE's and there were 6 triggered events.  We have discussed potentially starting low-dose Nebivolol 2.5 mg due to her history of asthma but she would like to defer that at this time and discuss with her primary care provider about her antianxiety medication to see if that would help.  She has been advised to notify the office by if she decides she would like to  start the medication.  She had previously been sent a referral to the [facility] POTS clinic for workup initiated her back.  Will follow back up on the referral.  Generalized anxiety disorder she continues to have ongoing management by her PCP.  Asthma that has been well-controlled.  She continues to use her rescue inhaler.  Ongoing management per PCP.        Dispo: Patient to return to clinic see MD/APP in 3 months or sooner if needed.  If she has elevated calcium score needs sooner follow-up appointment changes will be made at that time.  Signed, Sultan Pargas, NP

## 2023-12-14 NOTE — Patient Instructions (Signed)
 Medication Instructions:  No changes *If you need a refill on your cardiac medications before your next appointment, please call your pharmacy*  Lab Work: None ordered If you have labs (blood work) drawn today and your tests are completely normal, you will receive your results only by: MyChart Message (if you have MyChart) OR A paper copy in the mail If you have any lab test that is abnormal or we need to change your treatment, we will call you to review the results.  Testing/Procedures: None ordered  Follow-Up: At [facility], you and your health needs are our priority.  As part of our continuing mission to provide you with exceptional heart care, our providers are all part of one team.  This team includes your primary Cardiologist (physician) and Advanced Practice Providers or APPs (Physician Assistants and Nurse Practitioners) who all work together to provide you with the care you need, when you need it.  Your next appointment:   3 month(s)  Provider:   You may see Timothy Gollan, MD or one of the following Advanced Practice Providers on your designated Care Team:   Tylene Lunch, NP  We recommend signing up for the patient portal called MyChart.  Sign up information is provided on this After Visit Summary.  MyChart is used to connect with patients for Virtual Visits (Telemedicine).  Patients are able to view lab/test results, encounter notes, upcoming appointments, etc.  Non-urgent messages can be sent to your provider as well.   To learn more about what you can do with MyChart, go to [email or web address].

## 2023-12-20 ENCOUNTER — Ambulatory Visit: Admitting: Neurology

## 2024-01-17 ENCOUNTER — Encounter: Payer: Self-pay | Admitting: Family

## 2024-01-23 ENCOUNTER — Other Ambulatory Visit: Payer: Self-pay

## 2024-01-23 ENCOUNTER — Encounter: Payer: Self-pay | Admitting: Family

## 2024-01-23 DIAGNOSIS — R7303 Prediabetes: Secondary | ICD-10-CM

## 2024-01-23 DIAGNOSIS — E611 Iron deficiency: Secondary | ICD-10-CM

## 2024-01-23 DIAGNOSIS — E538 Deficiency of other specified B group vitamins: Secondary | ICD-10-CM

## 2024-01-23 DIAGNOSIS — I10 Essential (primary) hypertension: Secondary | ICD-10-CM

## 2024-01-23 DIAGNOSIS — R946 Abnormal results of thyroid function studies: Secondary | ICD-10-CM

## 2024-01-23 DIAGNOSIS — R7689 Other specified abnormal immunological findings in serum: Secondary | ICD-10-CM

## 2024-01-23 DIAGNOSIS — E559 Vitamin D deficiency, unspecified: Secondary | ICD-10-CM

## 2024-01-23 DIAGNOSIS — E782 Mixed hyperlipidemia: Secondary | ICD-10-CM

## 2024-01-23 DIAGNOSIS — R5383 Other fatigue: Secondary | ICD-10-CM

## 2024-01-23 NOTE — Assessment & Plan Note (Signed)
 Patient stable.  Well controlled with current therapy.   Continue current meds.

## 2024-01-23 NOTE — Progress Notes (Signed)
 "  Established Patient Office Visit  Subjective:  Patient ID: Cynthia Mcintosh, female    DOB: June 27, 1985  Age: 38 y.o. MRN: [id number]  Chief Complaint  Patient presents with   Follow-up    Paperwork    Patient here today to get her paperwork completed for her intermittent FMLA.  The company denied her previously requested accomodations.   Doing well currently.    No other concerns at this time.   Past Medical History:  Diagnosis Date   Anxiety    Asthma 05/06/2019   COVID    Major depressive disorder, single episode, moderate (HCC) 10/24/2022    Past Surgical History:  Procedure Laterality Date   TONSILECTOMY/ADENOIDECTOMY WITH MYRINGOTOMY      Social History   Socioeconomic History   Marital status: Single    Spouse name: Not on file   Number of children: Not on file   Years of education: Not on file   Highest education level: Not on file  Occupational History   Not on file  Tobacco Use   Smoking status: Never   Smokeless tobacco: Never  Vaping Use   Vaping status: Never Used  Substance and Sexual Activity   Alcohol use: Not Currently   Drug use: Not Currently   Sexual activity: Yes    Birth control/protection: Pill  Other Topics Concern   Not on file  Social History Narrative   Are you right handed or left handed? Left   Are you currently employed ?    What is your current occupation? [employer]   Do you live at home alone?yes   Who lives with you?    What type of home do you live in: 1 story or 2 story? two    Caffeine none    Social Drivers of Health   Tobacco Use: Low Risk (12/14/2023)   Patient History    Smoking Tobacco Use: Never    Smokeless Tobacco Use: Never    Passive Exposure: Not on file  Financial Resource Strain: Low Risk (07/10/2021)   Received from [facility]   Overall Financial Resource Strain (CARDIA)    Difficulty of Paying Living Expenses: Not hard at all  Food Insecurity: No Food Insecurity (07/10/2021)   Received from  [facility]   Epic    Within the past 12 months, you worried that your food would run out before you got the money to buy more.: Never true    Within the past 12 months, the food you bought just didn't last and you didn't have money to get more.: Never true  Transportation Needs: No Transportation Needs (07/10/2021)   Received from [facility]   PRAPARE - Transportation    Lack of Transportation (Medical): No    Lack of Transportation (Non-Medical): No  Physical Activity: Sufficiently Active (07/10/2021)   Received from [facility]   Exercise Vital Sign    On average, how many days per week do you engage in moderate to strenuous exercise (like a brisk walk)?: 5 days    On average, how many minutes do you engage in exercise at this level?: 30 min  Stress: Stress Concern Present (07/10/2021)   Received from [facility] of Occupational Health - Occupational Stress Questionnaire    Feeling of Stress : Rather much  Social Connections: Moderately Isolated (07/10/2021)   Received from [facility]   Social Connection and Isolation Panel    In a typical week,  how many times do you talk on the phone with family, friends, or neighbors?: More than three times a week    How often do you get together with friends or relatives?: More than three times a week    How often do you attend church or religious services?: 1 to 4 times per year    Do you belong to any clubs or organizations such as church groups, unions, fraternal or athletic groups, or school groups?: No    How often do you attend meetings of the clubs or organizations you belong to?: Never    Are you married, widowed, divorced, separated, never married, or living with a partner?: Never married  Intimate Partner Violence: Not At Risk (07/10/2021)   Received from [facility]   Epic    Within the last year, have you been afraid of your partner or ex-partner?: No    Within the last year, have you been  humiliated or emotionally abused in other ways by your partner or ex-partner?: No    Within the last year, have you been kicked, hit, slapped, or otherwise physically hurt by your partner or ex-partner?: No    Within the last year, have you been raped or forced to have any kind of sexual activity by your partner or ex-partner?: No  Depression (PHQ2-9): Not on file  Alcohol Screen: Not on file  Housing: Not on file  Utilities: Not on file  Health Literacy: Low Risk (07/10/2021)   Received from [facility] Literacy    How often do you need to have someone help you when you read instructions, pamphlets, or other written material from your doctor or pharmacy?: Never    Family History  Problem Relation Age of Onset   Hypertension Mother    Hypercholesterolemia Mother    Hypertension Father    Thyroid  disease Father    COPD Maternal Grandmother    Diabetes Maternal Grandfather    Hypertension Maternal Grandfather    Stroke Maternal Grandfather     Allergies[1]  Review of Systems  Psychiatric/Behavioral:  The patient is nervous/anxious.   All other systems reviewed and are negative.      Objective:   BP 122/78   Pulse 92   Ht 5' 7 (1.702 m)   Wt 169 lb (76.7 kg)   SpO2 98%   BMI 26.47 kg/m   Vitals:   11/22/23 1354  BP: 122/78  Pulse: 92  Height: 5' 7 (1.702 m)  Weight: 169 lb (76.7 kg)  SpO2: 98%  BMI (Calculated): 26.46    Physical Exam Vitals and nursing note reviewed.  Constitutional:      Appearance: Normal appearance. She is normal weight.  HENT:     Head: Normocephalic.  Eyes:     Extraocular Movements: Extraocular movements intact.     Conjunctiva/sclera: Conjunctivae normal.     Pupils: Pupils are equal, round, and reactive to light.  Cardiovascular:     Rate and Rhythm: Normal rate.  Pulmonary:     Effort: Pulmonary effort is normal.  Neurological:     General: No focal deficit present.     Mental Status: She is alert and oriented  to person, place, and time. Mental status is at baseline.  Psychiatric:        Attention and Perception: Attention and perception normal.        Mood and Affect: Affect normal. Mood is anxious.        Speech: Speech normal.  Behavior: Behavior normal. Behavior is cooperative.        Thought Content: Thought content normal.        Cognition and Memory: Cognition and memory normal.        Judgment: Judgment normal.      No results found for any visits on 11/22/23.  No results found for this or any previous visit (from the past 2160 hours).     Assessment & Plan Anxiety Moderate depressive disorder PMDD (premenstrual dysphoric disorder) Patient stable.  Well controlled with current therapy.   Continue current meds.   Gastroesophageal reflux disease with esophagitis without hemorrhage Patient stable.  Well controlled with current therapy.   Continue current meds.   Severe persistent asthma without complication (HCC) Patient stable.  Well controlled with current therapy.   Continue current meds.      No follow-ups on file.   Total time spent: 20 minutes  ALAN CHRISTELLA ARRANT, FNP  11/22/2023   This document may have been prepared by [facility] Voice Recognition software and as such may include unintentional dictation errors.     [1] No Known Allergies  "

## 2024-01-27 ENCOUNTER — Ambulatory Visit: Admitting: Family

## 2024-01-31 DIAGNOSIS — R42 Dizziness and giddiness: Secondary | ICD-10-CM

## 2024-02-03 ENCOUNTER — Ambulatory Visit: Admitting: Family

## 2024-02-03 ENCOUNTER — Encounter: Payer: Self-pay | Admitting: Family

## 2024-02-06 LAB — CMP14+EGFR
ALT: 10 IU/L (ref 0–32)
AST: 18 IU/L (ref 0–40)
Albumin: 4.3 g/dL (ref 3.9–4.9)
Alkaline Phosphatase: 47 IU/L (ref 41–116)
BUN/Creatinine Ratio: 15 (ref 9–23)
BUN: 13 mg/dL (ref 6–20)
Bilirubin Total: 0.2 mg/dL (ref 0.0–1.2)
CO2: 20 mmol/L (ref 20–29)
Calcium: 9.7 mg/dL (ref 8.7–10.2)
Chloride: 103 mmol/L (ref 96–106)
Creatinine, Ser: 0.86 mg/dL (ref 0.57–1.00)
Globulin, Total: 3 g/dL (ref 1.5–4.5)
Glucose: 90 mg/dL (ref 70–99)
Potassium: 4 mmol/L (ref 3.5–5.2)
Sodium: 140 mmol/L (ref 134–144)
Total Protein: 7.3 g/dL (ref 6.0–8.5)
eGFR: 89 mL/min/1.73

## 2024-02-06 LAB — IRON,TIBC AND FERRITIN PANEL
Ferritin: 115 ng/mL (ref 15–150)
Iron Saturation: 19 % (ref 15–55)
Iron: 59 ug/dL (ref 27–159)
Total Iron Binding Capacity: 308 ug/dL (ref 250–450)
UIBC: 249 ug/dL (ref 131–425)

## 2024-02-06 LAB — FSH+PROG+E2+SHBG
Estradiol: 230 pg/mL
FSH: 3.5 m[IU]/mL
Progesterone: 11.2 ng/mL
Sex Hormone Binding: 98.2 nmol/L (ref 24.6–122.0)

## 2024-02-06 LAB — HEMOGLOBIN A1C
Est. average glucose Bld gHb Est-mCnc: 114 mg/dL
Hgb A1c MFr Bld: 5.6 % (ref 4.8–5.6)

## 2024-02-06 LAB — TSH+T4F+T3FREE
Free T4: 1.17 ng/dL (ref 0.82–1.77)
T3, Free: 2.5 pg/mL (ref 2.0–4.4)
TSH: 1.33 u[IU]/mL (ref 0.450–4.500)

## 2024-02-06 LAB — LIPID PANEL W/O CHOL/HDL RATIO
Cholesterol, Total: 171 mg/dL (ref 100–199)
HDL: 71 mg/dL
LDL Chol Calc (NIH): 82 mg/dL (ref 0–99)
Triglycerides: 102 mg/dL (ref 0–149)
VLDL Cholesterol Cal: 18 mg/dL (ref 5–40)

## 2024-02-06 LAB — VITAMIN B12: Vitamin B-12: 947 pg/mL (ref 232–1245)

## 2024-02-06 LAB — MAGNESIUM: Magnesium: 2 mg/dL (ref 1.6–2.3)

## 2024-02-06 LAB — CORTISOL: Cortisol: 6.8 ug/dL (ref 6.2–19.4)

## 2024-02-06 LAB — VITAMIN D 25 HYDROXY (VIT D DEFICIENCY, FRACTURES): Vit D, 25-Hydroxy: 41 ng/mL (ref 30.0–100.0)

## 2024-02-06 LAB — ANTI-TPO AB (RDL): Anti-TPO Ab (RDL): 401.6 [IU]/mL — ABNORMAL HIGH

## 2024-02-07 NOTE — Addendum Note (Signed)
 Addended by: Jesaiah Fabiano D on: 02/07/2024 03:55 PM   Modules accepted: Orders

## 2024-02-07 NOTE — Telephone Encounter (Signed)
 Please update her referral.

## 2024-02-13 NOTE — Telephone Encounter (Signed)
 Referral and order faxed to [phone]

## 2024-03-12 ENCOUNTER — Ambulatory Visit: Admitting: Neurology
